# Patient Record
Sex: Male | Born: 1980 | Race: Black or African American | Hispanic: No | Marital: Single | Smoking: Current every day smoker
Health system: Southern US, Community
[De-identification: ages and names within clinical notes are randomized; demographics above are authoritative.]

## PROBLEM LIST (undated history)

## (undated) DIAGNOSIS — I10 Essential (primary) hypertension: Secondary | ICD-10-CM

## (undated) HISTORY — PX: BACK SURGERY: SHX140

## (undated) HISTORY — DX: Essential (primary) hypertension: I10

## (undated) HISTORY — PX: SPINE SURGERY: SHX786

---

## 2018-08-24 ENCOUNTER — Other Ambulatory Visit: Payer: Self-pay

## 2018-08-24 ENCOUNTER — Emergency Department
Admission: EM | Admit: 2018-08-24 | Discharge: 2018-08-24 | Disposition: A | Discharge status: Home / Self Care | Payer: Self-pay | Attending: Emergency Medicine | Admitting: Emergency Medicine

## 2018-08-24 DIAGNOSIS — R110 Nausea: Secondary | ICD-10-CM | POA: Insufficient documentation

## 2018-08-24 DIAGNOSIS — R0981 Nasal congestion: Secondary | ICD-10-CM | POA: Insufficient documentation

## 2018-08-24 DIAGNOSIS — J019 Acute sinusitis, unspecified: Secondary | ICD-10-CM | POA: Insufficient documentation

## 2018-08-24 MED ORDER — AMOXICILLIN-POT CLAVULANATE 875-125 MG PO TABS: 1.0000 | ORAL_TABLET | Freq: Two times a day (BID) | ORAL | 0 refills | Status: AC

## 2018-08-24 NOTE — ED Provider Notes (Signed)
[facility] Emergency Department Provider Note  Time seen: 11:31 AM  I have reviewed the triage vital signs and the nursing notes.   HISTORY  Chief Complaint Fever   HPI Christian [name] is a 38 y.o. male with no significant past medical history presents to the emergency department for headache and nasal congestion along with nausea.  According to the patient his girlfriend's aunt recently went to [place].  Patient denies any personal travel history.  Denies any known fevers at home.  Patient states occasional nausea, his main concern is congestion and headache.  No abdominal pain.  No chest pain.  Largely negative review of systems otherwise.   No past medical history on file.  There are no active problems to display for this patient.   Prior to Admission medications   Not on File    No Known Allergies  No family history on file.  Social History Social History   Tobacco Use  . Smoking status: Not on file  Substance Use Topics  . Alcohol use: Not on file  . Drug use: Not on file    Review of Systems Constitutional: Negative for fever. ENT: States nasal congestion/sinus pressure.  Sore throat. Cardiovascular: Negative for chest pain. Respiratory: Negative for shortness of breath.  No cough. Gastrointestinal: Negative for abdominal pain.  Occasional nausea.  No diarrhea. Genitourinary: Negative for urinary compaints Musculoskeletal: Negative for musculoskeletal complaints Skin: Negative for skin complaints  Neurological: Mild headache All other ROS negative  ____________________________________________   PHYSICAL EXAM:  VITAL SIGNS: ED Triage Vitals  Enc Vitals Group     BP 08/24/18 1109 (!) 170/113     Pulse Rate 08/24/18 1109 (!) 133     Resp --      Temp 08/24/18 1109 98.3 F (36.8 C)     Temp Source 08/24/18 1109 Oral     SpO2 08/24/18 1109 96 %     Weight 08/24/18 1110 200 lb (90.7 kg)     Height 08/24/18 1110 5\' 9"  (1.753 m)     Head Circumference --      Peak Flow --      Pain Score 08/24/18 1115 0     Pain Loc --      Pain Edu? --      Excl. in GC? --    Constitutional: Alert and oriented. Well appearing and in no distress. Eyes: Normal exam ENT   Head: Normocephalic and atraumatic.  Normal tympanic membranes   Nose: Moderate rhinorrhea, mild facial tenderness   Mouth/Throat: Mucous membranes are moist.  No pharyngeal erythema Cardiovascular: Normal rate, regular rhythm. No murmur Respiratory: Normal respiratory effort without tachypnea nor retractions. Breath sounds are clear Gastrointestinal: Soft and nontender. No distention.   Musculoskeletal: Nontender with normal range of motion in all extremities. Neurologic:  Normal speech and language. No gross focal neurologic deficits Skin:  Skin is warm, dry and intact.  Psychiatric: Mood and affect are normal.   ____________________________________________   INITIAL IMPRESSION / ASSESSMENT AND PLAN / ED COURSE  Pertinent labs & imaging results that were available during my care of the patient were reviewed by me and considered in my medical decision making (see chart for details).  Patient presents emergency department for symptoms of nasal congestion headache sinus pressure mild sore throat and nausea.  Symptoms are very suggestive of sinusitis.  Patient has no personal travel history, no fever, extremely low suspicion for coronavirus.  We will place the patient on antibiotic appropriate for  sinusitis and discharge with PCP follow-up.  Patient is requesting a note for work today.  Christian [name] was evaluated in Emergency Department on 08/24/2018 for the symptoms described in the history of present illness. He was evaluated in the context of the global COVID-19 pandemic, which necessitated consideration that the patient might be at risk for infection with the SARS-CoV-2 virus that causes COVID-19. Institutional protocols and algorithms that pertain to  the evaluation of patients at risk for COVID-19 are in a state of rapid change based on information released by regulatory bodies including the CDC and federal and state organizations. These policies and algorithms were followed during the patient's care in the ED.  ____________________________________________   FINAL CLINICAL IMPRESSION(S) / ED DIAGNOSES  Sinusitis   Minna Antis, MD 08/24/18 1135

## 2018-08-24 NOTE — ED Notes (Signed)
EDP at bedside  

## 2018-08-24 NOTE — ED Notes (Signed)
Pt number [phone]

## 2018-08-24 NOTE — ED Triage Notes (Signed)
Pt presents via POV c/o flu-like symptoms and fever x1 week. Report girlfriend has recent travel.

## 2019-04-16 ENCOUNTER — Encounter: Payer: Self-pay | Admitting: *Deleted

## 2019-04-16 ENCOUNTER — Other Ambulatory Visit: Payer: Self-pay

## 2019-04-16 DIAGNOSIS — R739 Hyperglycemia, unspecified: Secondary | ICD-10-CM | POA: Diagnosis present

## 2019-04-16 DIAGNOSIS — E876 Hypokalemia: Secondary | ICD-10-CM | POA: Diagnosis present

## 2019-04-16 DIAGNOSIS — K7010 Alcoholic hepatitis without ascites: Secondary | ICD-10-CM | POA: Diagnosis present

## 2019-04-16 DIAGNOSIS — R000 Tachycardia, unspecified: Secondary | ICD-10-CM | POA: Diagnosis present

## 2019-04-16 DIAGNOSIS — Z20828 Contact with and (suspected) exposure to other viral communicable diseases: Secondary | ICD-10-CM | POA: Diagnosis present

## 2019-04-16 DIAGNOSIS — F10139 Alcohol abuse with withdrawal, unspecified: Principal | ICD-10-CM | POA: Diagnosis present

## 2019-04-16 DIAGNOSIS — F17200 Nicotine dependence, unspecified, uncomplicated: Secondary | ICD-10-CM | POA: Diagnosis present

## 2019-04-16 DIAGNOSIS — R030 Elevated blood-pressure reading, without diagnosis of hypertension: Secondary | ICD-10-CM | POA: Diagnosis present

## 2019-04-16 DIAGNOSIS — Y900 Blood alcohol level of less than 20 mg/100 ml: Secondary | ICD-10-CM | POA: Diagnosis present

## 2019-04-16 DIAGNOSIS — D6959 Other secondary thrombocytopenia: Secondary | ICD-10-CM | POA: Diagnosis present

## 2019-04-16 DIAGNOSIS — R569 Unspecified convulsions: Secondary | ICD-10-CM | POA: Diagnosis present

## 2019-04-16 DIAGNOSIS — F1290 Cannabis use, unspecified, uncomplicated: Secondary | ICD-10-CM | POA: Diagnosis present

## 2019-04-16 LAB — URINE DRUG SCREEN, QUALITATIVE (ARMC ONLY)
Amphetamines, Ur Screen: NOT DETECTED
Barbiturates, Ur Screen: NOT DETECTED
Benzodiazepine, Ur Scrn: NOT DETECTED
Cannabinoid 50 Ng, Ur ~~LOC~~: NOT DETECTED
Cocaine Metabolite,Ur ~~LOC~~: NOT DETECTED
MDMA (Ecstasy)Ur Screen: NOT DETECTED
Methadone Scn, Ur: NOT DETECTED
Opiate, Ur Screen: NOT DETECTED
Phencyclidine (PCP) Ur S: NOT DETECTED
Tricyclic, Ur Screen: NOT DETECTED

## 2019-04-16 LAB — COMPREHENSIVE METABOLIC PANEL
ALT: 153 U/L — ABNORMAL HIGH (ref 0–44)
AST: 224 U/L — ABNORMAL HIGH (ref 15–41)
Albumin: 4.6 g/dL (ref 3.5–5.0)
Alkaline Phosphatase: 87 U/L (ref 38–126)
Anion gap: 17 — ABNORMAL HIGH (ref 5–15)
BUN: 7 mg/dL (ref 6–20)
CO2: 23 mmol/L (ref 22–32)
Calcium: 9.5 mg/dL (ref 8.9–10.3)
Chloride: 96 mmol/L — ABNORMAL LOW (ref 98–111)
Creatinine, Ser: 0.57 mg/dL — ABNORMAL LOW (ref 0.61–1.24)
GFR calc Af Amer: >60 mL/min (ref >60)
GFR calc non Af Amer: >60 mL/min (ref >60)
Glucose, Bld: 221 mg/dL — ABNORMAL HIGH (ref 70–99)
Potassium: 2.7 mmol/L — CL (ref 3.5–5.1)
Sodium: 136 mmol/L (ref 135–145)
Total Bilirubin: 2.5 mg/dL — ABNORMAL HIGH (ref 0.3–1.2)
Total Protein: 8.6 g/dL — ABNORMAL HIGH (ref 6.5–8.1)

## 2019-04-16 LAB — CBC
HCT: 37.9 % — ABNORMAL LOW (ref 39.0–52.0)
Hemoglobin: 14.2 g/dL (ref 13.0–17.0)
MCH: 33.1 pg (ref 26.0–34.0)
MCHC: 37.5 g/dL — ABNORMAL HIGH (ref 30.0–36.0)
MCV: 88.3 fL (ref 80.0–100.0)
Platelets: 60 10*3/uL — ABNORMAL LOW (ref 150–400)
RBC: 4.29 MIL/uL (ref 4.22–5.81)
RDW: 19.2 % — ABNORMAL HIGH (ref 11.5–15.5)
WBC: 5.5 10*3/uL (ref 4.0–10.5)
nRBC: 0 % (ref 0.0–0.2)

## 2019-04-16 LAB — ETHANOL: Alcohol, Ethyl (B): <10 mg/dL (ref <10)

## 2019-04-16 NOTE — ED Triage Notes (Signed)
Pt reports that his girlfriend had a hard time waking him up tonight so she called EMS. Pt says it has been about 5 days since he has had a drink. He said he has been shaky, vomiting. No SI/HI.

## 2019-04-17 ENCOUNTER — Observation Stay: Payer: Self-pay

## 2019-04-17 ENCOUNTER — Inpatient Hospital Stay
Admission: EM | Admit: 2019-04-17 | Discharge: 2019-04-18 | DRG: 897 | Disposition: A | Discharge status: Home / Self Care | Payer: Self-pay | Attending: Family Medicine | Admitting: Family Medicine

## 2019-04-17 ENCOUNTER — Encounter: Payer: Self-pay | Admitting: Internal Medicine

## 2019-04-17 DIAGNOSIS — F10239 Alcohol dependence with withdrawal, unspecified: Secondary | ICD-10-CM

## 2019-04-17 DIAGNOSIS — R0989 Other specified symptoms and signs involving the circulatory and respiratory systems: Secondary | ICD-10-CM

## 2019-04-17 DIAGNOSIS — F1010 Alcohol abuse, uncomplicated: Secondary | ICD-10-CM

## 2019-04-17 DIAGNOSIS — E876 Hypokalemia: Secondary | ICD-10-CM

## 2019-04-17 DIAGNOSIS — R569 Unspecified convulsions: Secondary | ICD-10-CM

## 2019-04-17 DIAGNOSIS — D696 Thrombocytopenia, unspecified: Secondary | ICD-10-CM | POA: Diagnosis present

## 2019-04-17 DIAGNOSIS — F10939 Alcohol use, unspecified with withdrawal, unspecified: Secondary | ICD-10-CM

## 2019-04-17 LAB — COMPREHENSIVE METABOLIC PANEL
ALT: 130 U/L — ABNORMAL HIGH (ref 0–44)
AST: 162 U/L — ABNORMAL HIGH (ref 15–41)
Albumin: 4.1 g/dL (ref 3.5–5.0)
Alkaline Phosphatase: 73 U/L (ref 38–126)
Anion gap: 12 (ref 5–15)
BUN: 7 mg/dL (ref 6–20)
CO2: 26 mmol/L (ref 22–32)
Calcium: 9.3 mg/dL (ref 8.9–10.3)
Chloride: 99 mmol/L (ref 98–111)
Creatinine, Ser: 0.58 mg/dL — ABNORMAL LOW (ref 0.61–1.24)
GFR calc Af Amer: >60 mL/min (ref >60)
GFR calc non Af Amer: >60 mL/min (ref >60)
Glucose, Bld: 129 mg/dL — ABNORMAL HIGH (ref 70–99)
Potassium: 2.8 mmol/L — ABNORMAL LOW (ref 3.5–5.1)
Sodium: 137 mmol/L (ref 135–145)
Total Bilirubin: 2.4 mg/dL — ABNORMAL HIGH (ref 0.3–1.2)
Total Protein: 7.7 g/dL (ref 6.5–8.1)

## 2019-04-17 LAB — CBC
HCT: 37.2 % — ABNORMAL LOW (ref 39.0–52.0)
Hemoglobin: 13.3 g/dL (ref 13.0–17.0)
MCH: 29.5 pg (ref 26.0–34.0)
MCHC: 35.8 g/dL (ref 30.0–36.0)
MCV: 82.5 fL (ref 80.0–100.0)
Platelets: 55 10*3/uL — ABNORMAL LOW (ref 150–400)
RBC: 4.51 MIL/uL (ref 4.22–5.81)
RDW: 16.9 % — ABNORMAL HIGH (ref 11.5–15.5)
WBC: 5.6 10*3/uL (ref 4.0–10.5)
nRBC: 0 % (ref 0.0–0.2)

## 2019-04-17 LAB — HEPATITIS PANEL, ACUTE
HCV Ab: NONREACTIVE
Hep A IgM: NONREACTIVE
Hep B C IgM: NONREACTIVE
Hepatitis B Surface Ag: NONREACTIVE

## 2019-04-17 LAB — VITAMIN B12: Vitamin B-12: 287 pg/mL (ref 180–914)

## 2019-04-17 LAB — HIV ANTIBODY (ROUTINE TESTING W REFLEX): HIV Screen 4th Generation wRfx: NONREACTIVE

## 2019-04-17 LAB — SARS CORONAVIRUS 2 (TAT 6-24 HRS): SARS Coronavirus 2: NEGATIVE

## 2019-04-17 LAB — HEMOGLOBIN A1C
Hgb A1c MFr Bld: 5.5 % (ref 4.8–5.6)
Mean Plasma Glucose: 111.15 mg/dL

## 2019-04-17 LAB — MAGNESIUM
Magnesium: 1.1 mg/dL — ABNORMAL LOW (ref 1.7–2.4)
Magnesium: 1.5 mg/dL — ABNORMAL LOW (ref 1.7–2.4)

## 2019-04-17 LAB — PHOSPHORUS: Phosphorus: 1.5 mg/dL — ABNORMAL LOW (ref 2.5–4.6)

## 2019-04-17 MED ORDER — LORAZEPAM 2 MG/ML IJ SOLN
2.0000 mg | Freq: Once | INTRAMUSCULAR | Status: AC
  Administered 2019-04-17: 2 mg via INTRAVENOUS

## 2019-04-17 MED ORDER — ACETAMINOPHEN 325 MG PO TABS: 650.0000 mg | ORAL_TABLET | Freq: Four times a day (QID) | ORAL | Status: DC | PRN

## 2019-04-17 MED ORDER — POTASSIUM CHLORIDE CRYS ER 20 MEQ PO TBCR
40.0000 meq | EXTENDED_RELEASE_TABLET | Freq: Two times a day (BID) | ORAL | Status: DC
  Administered 2019-04-17 (×2): 40 meq via ORAL
  Filled 2019-04-17: qty 2

## 2019-04-17 MED ORDER — MAGNESIUM SULFATE 2 GM/50ML IV SOLN
2.0000 g | Freq: Once | INTRAVENOUS | Status: AC
  Administered 2019-04-17: 2 g via INTRAVENOUS
  Filled 2019-04-17: qty 50

## 2019-04-17 MED ORDER — ACETAMINOPHEN 650 MG RE SUPP: 650.0000 mg | Freq: Four times a day (QID) | RECTAL | Status: DC | PRN

## 2019-04-17 MED ORDER — FOLIC ACID 1 MG PO TABS
1.0000 mg | ORAL_TABLET | Freq: Every day | ORAL | Status: DC
  Administered 2019-04-17: 1 mg via ORAL
  Filled 2019-04-17: qty 1

## 2019-04-17 MED ORDER — VITAMIN B-1 100 MG PO TABS
100.0000 mg | ORAL_TABLET | Freq: Every day | ORAL | Status: DC
  Administered 2019-04-17: 100 mg via ORAL
  Filled 2019-04-17: qty 1

## 2019-04-17 MED ORDER — LORAZEPAM 2 MG/ML IJ SOLN: 1.0000 mg | INTRAMUSCULAR | Status: DC | PRN

## 2019-04-17 MED ORDER — CHLORDIAZEPOXIDE HCL 25 MG PO CAPS: 25.0000 mg | ORAL_CAPSULE | Freq: Two times a day (BID) | ORAL | Status: DC

## 2019-04-17 MED ORDER — MAGNESIUM OXIDE 400 (241.3 MG) MG PO TABS
400.0000 mg | ORAL_TABLET | Freq: Once | ORAL | Status: AC
  Administered 2019-04-17: 400 mg via ORAL
  Filled 2019-04-17: qty 1

## 2019-04-17 MED ORDER — POTASSIUM CHLORIDE CRYS ER 20 MEQ PO TBCR: 40.0000 meq | EXTENDED_RELEASE_TABLET | ORAL | Status: AC

## 2019-04-17 MED ORDER — THIAMINE HCL 100 MG/ML IJ SOLN: 100.0000 mg | Freq: Every day | INTRAMUSCULAR | Status: DC

## 2019-04-17 MED ORDER — LORAZEPAM 1 MG PO TABS
1.0000 mg | ORAL_TABLET | ORAL | Status: DC | PRN
  Filled 2019-04-17: qty 1

## 2019-04-17 MED ORDER — LORAZEPAM 2 MG PO TABS
0.0000 mg | ORAL_TABLET | Freq: Four times a day (QID) | ORAL | Status: DC
  Administered 2019-04-17 (×2): 2 mg via ORAL
  Administered 2019-04-17: 1 mg via ORAL
  Filled 2019-04-17 (×2): qty 1

## 2019-04-17 MED ORDER — VITAMIN B-1 100 MG PO TABS: 100.0000 mg | ORAL_TABLET | Freq: Every day | ORAL | Status: DC

## 2019-04-17 MED ORDER — ONDANSETRON HCL 4 MG/2ML IJ SOLN: 4.0000 mg | Freq: Four times a day (QID) | INTRAMUSCULAR | Status: DC | PRN

## 2019-04-17 MED ORDER — CHLORDIAZEPOXIDE HCL 25 MG PO CAPS: 25.0000 mg | ORAL_CAPSULE | Freq: Every day | ORAL | Status: DC

## 2019-04-17 MED ORDER — LABETALOL HCL 5 MG/ML IV SOLN
INTRAVENOUS | Status: AC
  Administered 2019-04-17: 10 mg via INTRAVENOUS
  Filled 2019-04-17: qty 4

## 2019-04-17 MED ORDER — LORAZEPAM 2 MG/ML IJ SOLN
1.0000 mg | Freq: Once | INTRAMUSCULAR | Status: AC
  Administered 2019-04-17: 1 mg via INTRAVENOUS

## 2019-04-17 MED ORDER — LORAZEPAM 2 MG PO TABS: 0.0000 mg | ORAL_TABLET | Freq: Two times a day (BID) | ORAL | Status: DC

## 2019-04-17 MED ORDER — LORAZEPAM 2 MG/ML IJ SOLN
0.0000 mg | Freq: Four times a day (QID) | INTRAMUSCULAR | Status: DC
  Administered 2019-04-17: 1 mg via INTRAVENOUS

## 2019-04-17 MED ORDER — POTASSIUM CHLORIDE 10 MEQ/100ML IV SOLN
10.0000 meq | INTRAVENOUS | Status: AC
  Administered 2019-04-17 (×4): 10 meq via INTRAVENOUS
  Filled 2019-04-17 (×4): qty 100

## 2019-04-17 MED ORDER — LORAZEPAM 2 MG PO TABS: 0.0000 mg | ORAL_TABLET | Freq: Four times a day (QID) | ORAL | Status: DC

## 2019-04-17 MED ORDER — NICOTINE 21 MG/24HR TD PT24
21.0000 mg | MEDICATED_PATCH | Freq: Every day | TRANSDERMAL | Status: DC
  Administered 2019-04-17: 21 mg via TRANSDERMAL
  Filled 2019-04-17: qty 1

## 2019-04-17 MED ORDER — LORAZEPAM 2 MG/ML IJ SOLN: 0.0000 mg | Freq: Two times a day (BID) | INTRAMUSCULAR | Status: DC

## 2019-04-17 MED ORDER — ONDANSETRON HCL 4 MG PO TABS: 4.0000 mg | ORAL_TABLET | Freq: Four times a day (QID) | ORAL | Status: DC | PRN

## 2019-04-17 MED ORDER — GABAPENTIN 400 MG PO CAPS
400.0000 mg | ORAL_CAPSULE | Freq: Three times a day (TID) | ORAL | Status: DC
  Administered 2019-04-17 (×3): 400 mg via ORAL
  Filled 2019-04-17 (×7): qty 1

## 2019-04-17 MED ORDER — ADULT MULTIVITAMIN W/MINERALS CH
1.0000 | ORAL_TABLET | Freq: Every day | ORAL | Status: DC
  Administered 2019-04-17: 1 via ORAL
  Filled 2019-04-17: qty 1

## 2019-04-17 MED ORDER — CHLORDIAZEPOXIDE HCL 25 MG PO CAPS
25.0000 mg | ORAL_CAPSULE | Freq: Three times a day (TID) | ORAL | Status: AC
  Administered 2019-04-17 (×3): 25 mg via ORAL
  Filled 2019-04-17 (×3): qty 1

## 2019-04-17 MED ORDER — LABETALOL HCL 5 MG/ML IV SOLN
10.0000 mg | INTRAVENOUS | Status: DC | PRN
  Administered 2019-04-17: 06:00:00 10 mg via INTRAVENOUS
  Filled 2019-04-17: qty 4

## 2019-04-17 NOTE — ED Notes (Signed)
Pt alert, given meal try and drink as requested. No emesis.

## 2019-04-17 NOTE — Progress Notes (Signed)
Pt went out side to smoke. Nurse educated pt that he can not do that. notify MD to request patch.

## 2019-04-17 NOTE — ED Notes (Addendum)
ED TO INPATIENT HANDOFF REPORT  ED Nurse Name and Phone #: ally [phone]  S Name/Age/Gender Christian [name] 38 y.o. male Room/Bed: ED01A/ED01A  Code Status   Code Status: Full Code  Home/SNF/Other Home Patient oriented to: self, place, time and situation Is this baseline? Yes   Triage Complete: Triage complete  Chief Complaint withdrawls ems  Triage Note Pt reports that his girlfriend had a hard time waking him up tonight so she called EMS. Pt says it has been about 5 days since he has had a drink. He said he has been shaky, vomiting. No SI/HI.      Allergies No Known Allergies  Level of Care/Admitting Diagnosis ED Disposition    ED Disposition Condition Comment   Admit  Hospital Area: [facility] [[id number]]  Level of Care: Med-Surg [16]  Covid Evaluation: Asymptomatic Screening Protocol (No Symptoms)  Diagnosis: Seizures (HCC) [119147]  Admitting Physician: Eduard Clos [phone]  Attending Physician: Eduard Clos Florian.Pax  PT Class (Do Not Modify): Observation [104]  PT Acc Code (Do Not Modify): Observation [10022]       B Medical/Surgery History History reviewed. No pertinent past medical history. Past Surgical History:  Procedure Laterality Date  . BACK SURGERY       A IV Location/Drains/Wounds Patient Lines/Drains/Airways Status   Active Line/Drains/Airways    Name:   Placement date:   Placement time:   Site:   Days:   Peripheral IV 04/17/19 Right Antecubital   04/17/19    0039    Antecubital   less than 1   Peripheral IV 04/17/19 Left Forearm   04/17/19    0340    Forearm   less than 1          Intake/Output Last 24 hours No intake or output data in the 24 hours ending 04/17/19 1051  Labs/Imaging Results for orders placed or performed during the hospital encounter of 04/17/19 (from the past 48 hour(s))  Comprehensive metabolic panel     Status: Abnormal   Collection Time: 04/16/19  9:06 PM  Result Value Ref Range   Sodium 136 135 - 145 mmol/L   Potassium 2.7 (LL) 3.5 - 5.1 mmol/L    Comment: CRITICAL RESULT CALLED TO, READ BACK BY AND VERIFIED WITH LISA THOMPSON 04/16/19 @ 2201  MLK    Chloride 96 (L) 98 - 111 mmol/L   CO2 23 22 - 32 mmol/L   Glucose, Bld 221 (H) 70 - 99 mg/dL   BUN 7 6 - 20 mg/dL   Creatinine, Ser 6.21 (L) 0.61 - 1.24 mg/dL   Calcium 9.5 8.9 - 30.8 mg/dL   Total Protein 8.6 (H) 6.5 - 8.1 g/dL   Albumin 4.6 3.5 - 5.0 g/dL   AST 657 (H) 15 - 41 U/L   ALT 153 (H) 0 - 44 U/L   Alkaline Phosphatase 87 38 - 126 U/L   Total Bilirubin 2.5 (H) 0.3 - 1.2 mg/dL   GFR calc non Af Amer >60 >60 mL/min   GFR calc Af Amer >60 >60 mL/min   Anion gap 17 (H) 5 - 15    Comment: Performed at [facility], [address] [place], [place] [address]  Ethanol     Status: None   Collection Time: 04/16/19  9:06 PM  Result Value Ref Range   Alcohol, Ethyl (B) <10 <10 mg/dL    Comment: (NOTE) Lowest detectable limit for serum alcohol is 10 mg/dL. For medical purposes only. Performed at  [facility], [address] [place], [place] [address]   cbc     Status: Abnormal   Collection Time: 04/16/19  9:06 PM  Result Value Ref Range   WBC 5.5 4.0 - 10.5 K/uL   RBC 4.29 4.22 - 5.81 MIL/uL   Hemoglobin 14.2 13.0 - 17.0 g/dL   HCT 60.437.9 (L) 54.039.0 - 98.152.0 %   MCV 88.3 80.0 - 100.0 fL   MCH 33.1 26.0 - 34.0 pg   MCHC 37.5 (H) 30.0 - 36.0 g/dL   RDW 19.119.2 (H) 47.811.5 - 29.515.5 %   Platelets 60 (L) 150 - 400 K/uL    Comment: Immature Platelet Fraction may be clinically indicated, consider ordering this additional test [id number] PLATELET COUNT CONFIRMED BY SMEAR    nRBC 0.0 0.0 - 0.2 %    Comment: Performed at [name], [address] [place], [place] [address]  Urine Drug Screen, Qualitative     Status: None   Collection Time: 04/16/19  9:06 PM  Result Value Ref Range   Tricyclic, Ur Screen NONE DETECTED NONE DETECTED   Amphetamines, Ur Screen NONE DETECTED NONE DETECTED    MDMA (Ecstasy)Ur Screen NONE DETECTED NONE DETECTED   Cocaine Metabolite,Ur [place] NONE DETECTED NONE DETECTED   Opiate, Ur Screen NONE DETECTED NONE DETECTED   Phencyclidine (PCP) Ur S NONE DETECTED NONE DETECTED   Cannabinoid 50 Ng, Ur [place] NONE DETECTED NONE DETECTED   Barbiturates, Ur Screen NONE DETECTED NONE DETECTED   Benzodiazepine, Ur Scrn NONE DETECTED NONE DETECTED   Methadone Scn, Ur NONE DETECTED NONE DETECTED    Comment: (NOTE) Tricyclics + metabolites, urine    Cutoff 1000 ng/mL Amphetamines + metabolites, urine  Cutoff 1000 ng/mL MDMA (Ecstasy), urine              Cutoff 500 ng/mL Cocaine Metabolite, urine          Cutoff 300 ng/mL Opiate + metabolites, urine        Cutoff 300 ng/mL Phencyclidine (PCP), urine         Cutoff 25 ng/mL Cannabinoid, urine                 Cutoff 50 ng/mL Barbiturates + metabolites, urine  Cutoff 200 ng/mL Benzodiazepine, urine              Cutoff 200 ng/mL Methadone, urine                   Cutoff 300 ng/mL The urine drug screen provides only a preliminary, unconfirmed analytical test result and should not be used for non-medical purposes. Clinical consideration and professional judgment should be applied to any positive drug screen result due to possible interfering substances. A more specific alternate chemical method must be used in order to obtain a confirmed analytical result. Gas chromatography / mass spectrometry (GC/MS) is the preferred confirmat ory method. Performed at [facility], [address] [place], [place] [address]   Magnesium     Status: Abnormal   Collection Time: 04/16/19  9:06 PM  Result Value Ref Range   Magnesium 1.1 (L) 1.7 - 2.4 mg/dL    Comment: Performed at [facility], [address] [place], [place] [address]  Comprehensive metabolic panel     Status: Abnormal   Collection Time: 04/17/19  3:46 AM  Result Value Ref Range   Sodium 137 135 - 145 mmol/L   Potassium 2.8 (L) 3.5 - 5.1  mmol/L   Chloride 99  98 - 111 mmol/L   CO2 26 22 - 32 mmol/L   Glucose, Bld 129 (H) 70 - 99 mg/dL   BUN 7 6 - 20 mg/dL   Creatinine, Ser 2.62 (L) 0.61 - 1.24 mg/dL   Calcium 9.3 8.9 - 03.5 mg/dL   Total Protein 7.7 6.5 - 8.1 g/dL   Albumin 4.1 3.5 - 5.0 g/dL   AST 597 (H) 15 - 41 U/L   ALT 130 (H) 0 - 44 U/L   Alkaline Phosphatase 73 38 - 126 U/L   Total Bilirubin 2.4 (H) 0.3 - 1.2 mg/dL   GFR calc non Af Amer >60 >60 mL/min   GFR calc Af Amer >60 >60 mL/min   Anion gap 12 5 - 15    Comment: Performed at [facility], [address] [place], [place] [address]  Magnesium     Status: Abnormal   Collection Time: 04/17/19  3:46 AM  Result Value Ref Range   Magnesium 1.5 (L) 1.7 - 2.4 mg/dL    Comment: Performed at [facility], [address], [place], [place] [address]  Phosphorus     Status: Abnormal   Collection Time: 04/17/19  3:46 AM  Result Value Ref Range   Phosphorus 1.5 (L) 2.5 - 4.6 mg/dL    Comment: Performed at [facility], [address] [place], [place] [address]  CBC     Status: Abnormal   Collection Time: 04/17/19  3:46 AM  Result Value Ref Range   WBC 5.6 4.0 - 10.5 K/uL   RBC 4.51 4.22 - 5.81 MIL/uL   Hemoglobin 13.3 13.0 - 17.0 g/dL   HCT 12.2 (L) 48.2 - 50.0 %   MCV 82.5 80.0 - 100.0 fL   MCH 29.5 26.0 - 34.0 pg   MCHC 35.8 30.0 - 36.0 g/dL    Comment: CORRECTED FOR COLD AGGLUTININS   RDW 16.9 (H) 11.5 - 15.5 %   Platelets 55 (L) 150 - 400 K/uL    Comment: Immature Platelet Fraction may be clinically indicated, consider ordering this additional test [id number]    nRBC 0.0 0.0 - 0.2 %    Comment: Performed at [facility], [address] [place], [place] [address]  Hemoglobin A1c     Status: None   Collection Time: 04/17/19  6:05 AM  Result Value Ref Range   Hgb A1c MFr Bld 5.5 4.8 - 5.6 %    Comment: (NOTE) Pre diabetes:          5.7%-6.4% Diabetes:              >6.4% Glycemic control for   <7.0% adults  with diabetes    Mean Plasma Glucose 111.15 mg/dL    Comment: Performed at [facility], [address] [place], [place] [address]   Ct Head Wo Contrast  Result Date: 04/17/2019 CLINICAL DATA:  38 year old male with altered mental status. EXAM: CT HEAD WITHOUT CONTRAST TECHNIQUE: Contiguous axial images were obtained from the base of the skull through the vertex without intravenous contrast. COMPARISON:  None. FINDINGS: Brain: No midline shift, ventriculomegaly, mass effect, evidence of mass lesion, intracranial hemorrhage or evidence of cortically based acute infarction. Gray-white matter differentiation is within normal limits throughout the brain. Vascular: No suspicious intracranial vascular hyperdensity. Skull: Negative. Sinuses/Orbits: Visualized paranasal sinuses and mastoids are clear. Other: Visualized orbits and scalp soft tissues are within normal limits. IMPRESSION: Normal noncontrast head CT. Electronically Signed   By: Althea Grimmer.D.  On: 04/17/2019 02:20   Dg Chest Port 1 View  Result Date: 04/17/2019 CLINICAL DATA:  Seizure, abnormal lung sounds, reported history of alcoholism EXAM: PORTABLE CHEST 1 VIEW COMPARISON:  None. FINDINGS: Posterior spinal fusion hardware overlies the upper and lower thoracic spine. Normal heart size. Normal mediastinal contour. No pneumothorax. No pleural effusion. Lungs appear clear, with no acute consolidative airspace disease and no pulmonary edema. IMPRESSION: No active disease. Electronically Signed   By: Delbert Phenix M.D.   On: 04/17/2019 09:30    Pending Labs Unresulted Labs (From admission, onward)    Start     Ordered   04/17/19 0909  Vitamin B12  Add-on,   AD     04/17/19 1610   04/17/19 0553  Hepatitis panel, acute  Once,   STAT     04/17/19 0552   04/17/19 0500  HIV Antibody (routine testing w rflx)  (HIV Antibody (Routine testing w reflex) panel)  Tomorrow morning,   STAT     04/17/19 0318   04/17/19 0101  SARS CORONAVIRUS 2 (TAT  6-24 HRS) Nasopharyngeal Nasopharyngeal Swab  (Asymptomatic/Tier 2 Patients Labs)  Once,   STAT    Question Answer Comment  Is this test for diagnosis or screening Screening   Symptomatic for COVID-19 as defined by CDC No   Hospitalized for COVID-19 No   Admitted to ICU for COVID-19 No   Previously tested for COVID-19 No   Resident in a congregate (group) care setting No   Employed in healthcare setting No      04/17/19 0100          Vitals/Pain Today's Vitals   04/17/19 0730 04/17/19 0739 04/17/19 0800 04/17/19 1033  BP: (!) 148/108  (!) 136/102 (!) 143/96  Pulse: 94  (!) 101 (!) 106  Resp: 20  (!) 21   Temp:      TempSrc:      SpO2: 100%  100% 100%  Weight:      Height:      PainSc:  0-No pain  0-No pain    Isolation Precautions No active isolations  Medications Medications  LORazepam (ATIVAN) tablet 1-4 mg (has no administration in time range)    Or  LORazepam (ATIVAN) injection 1-4 mg (has no administration in time range)  thiamine (VITAMIN B-1) tablet 100 mg (100 mg Oral Given 04/17/19 0854)    Or  thiamine (B-1) injection 100 mg ( Intravenous See Alternative 04/17/19 0854)  folic acid (FOLVITE) tablet 1 mg (1 mg Oral Given 04/17/19 0853)  multivitamin with minerals tablet 1 tablet (1 tablet Oral Given 04/17/19 0853)  acetaminophen (TYLENOL) tablet 650 mg (has no administration in time range)    Or  acetaminophen (TYLENOL) suppository 650 mg (has no administration in time range)  LORazepam (ATIVAN) tablet 0-4 mg (2 mg Oral Given 04/17/19 0853)    Followed by  LORazepam (ATIVAN) tablet 0-4 mg (has no administration in time range)  potassium chloride SA (KLOR-CON) CR tablet 40 mEq (0 mEq Oral Hold 04/17/19 0545)  labetalol (NORMODYNE) injection 10 mg (10 mg Intravenous Given 04/17/19 0604)  chlordiazePOXIDE (LIBRIUM) capsule 25 mg (25 mg Oral Given 04/17/19 0932)    Followed by  chlordiazePOXIDE (LIBRIUM) capsule 25 mg (has no administration in time range)     Followed by  chlordiazePOXIDE (LIBRIUM) capsule 25 mg (has no administration in time range)  gabapentin (NEURONTIN) capsule 400 mg (400 mg Oral Given 04/17/19 0932)  LORazepam (ATIVAN) injection 2 mg (2 mg Intravenous Given  04/17/19 0042)  LORazepam (ATIVAN) injection 1 mg (1 mg Intravenous Given 04/17/19 0042)  potassium chloride 10 mEq in 100 mL IVPB (0 mEq Intravenous Stopped 04/17/19 0558)  magnesium sulfate IVPB 2 g 50 mL (0 g Intravenous Stopped 04/17/19 0647)    Mobility walks High fall risk   Focused Assessments Neuro Assessment Handoff:   Cardiac Rhythm: Normal sinus rhythm       Neuro Assessment:   Neuro Checks:      Last Documented NIHSS Modified Score:   Has TPA been given? No If patient is a Neuro Trauma and patient is going to OR before floor call report to [facility] nurse: [phone] or [phone]     R Recommendations: See Admitting Provider Note  Report given to:   Additional Notes:  Pt had seizure in ER.  No seizure this AM, eating and drinking, ambulates to restroom safely.

## 2019-04-17 NOTE — Progress Notes (Addendum)
PROGRESS NOTE    Christian [name]  VHQ:[id number] DOB: [birth year] DOA: 04/17/2019 PCP: Patient, No Pcp Per      Brief Narrative:  Christian [name] is a 38 y.o. M with alcohol use disorder who presented after a witnessed seizure.  The patient is interviewing for a new job soon, and so he quit drinking 48 hours prior to admission.  He initially had shakes, malaise, nausea, but these gradually resolved until the day of admission he was witnessed to have a generalized tonic-clonic seizure for several minutes.  In the ER, the patient had another witnessed tonic-clonic seizure, which was aborted with Ativan.  Potassium 2.7, LFTs slightly elevated, alcohol level negative.       Assessment & Plan:  Alcohol withdrawal seizure Alcohol use disorder -Start Librium taper -Start gabapentin 400 3 times daily -Consult social work -Surveyor, quantity with on-demand additional Ativan -Continue thiamine and folate -Seizure precautions    Hypokalemia Hypomagnesemia -Supplement potassium -Supplement magnesium  Thrombocytopenia Due to alcohol use likely. -Follow-up with PCP  Transaminitis Ackley alcoholic hepatitis. -Trend LFTs -Follow-up acute hepatitis panel    DVT prophylaxis: SCDs Code Status: Full code Family Communication: Fiance MDM and disposition Plan: This is a no charge note.  For further details, please see H&P by my partner Dr. Hal Hope from earlier today.  The below labs and imaging reports were reviewed and summarized above.    The patient was admitted with alcoholic withdrawal related seizure.    Objective: Vitals:   04/17/19 0800 04/17/19 1033 04/17/19 1241 04/17/19 1430  BP: (!) 136/102 (!) 143/96 (!) 132/91 (!) 130/94  Pulse: (!) 101 (!) 106 95 100  Resp: (!) 21  20 (!) 23  Temp:      TempSrc:      SpO2: 100% 100% 96% 97%  Weight:      Height:       No intake or output data in the 24 hours ending 04/17/19 1507 Filed Weights   04/16/19 2102  Weight: 72.1 kg     Examination: The patient was seen and examined.      Data Reviewed: I have personally reviewed following labs and imaging studies:  CBC: Recent Labs  Lab 04/16/19 2106 04/17/19 0346  WBC 5.5 5.6  HGB 14.2 13.3  HCT 37.9* 37.2*  MCV 88.3 82.5  PLT 60* 55*   Basic Metabolic Panel: Recent Labs  Lab 04/16/19 2106 04/17/19 0346  NA 136 137  K 2.7* 2.8*  CL 96* 99  CO2 23 26  GLUCOSE 221* 129*  BUN 7 7  CREATININE 0.57* 0.58*  CALCIUM 9.5 9.3  MG 1.1* 1.5*  PHOS  --  1.5*   GFR: Estimated Creatinine Clearance: 125.2 mL/min (A) (by C-G formula based on SCr of 0.58 mg/dL (L)). Liver Function Tests: Recent Labs  Lab 04/16/19 2106 04/17/19 0346  AST 224* 162*  ALT 153* 130*  ALKPHOS 87 73  BILITOT 2.5* 2.4*  PROT 8.6* 7.7  ALBUMIN 4.6 4.1   No results for input(s): LIPASE, AMYLASE in the last 168 hours. No results for input(s): AMMONIA in the last 168 hours. Coagulation Profile: No results for input(s): INR, PROTIME in the last 168 hours. Cardiac Enzymes: No results for input(s): CKTOTAL, CKMB, CKMBINDEX, TROPONINI in the last 168 hours. BNP (last 3 results) No results for input(s): PROBNP in the last 8760 hours. HbA1C: Recent Labs    04/17/19 0605  HGBA1C 5.5   CBG: No results for input(s): GLUCAP in the last 168 hours. Lipid Profile:  No results for input(s): CHOL, HDL, LDLCALC, TRIG, CHOLHDL, LDLDIRECT in the last 72 hours. Thyroid Function Tests: No results for input(s): TSH, T4TOTAL, FREET4, T3FREE, THYROIDAB in the last 72 hours. Anemia Panel: Recent Labs    04/17/19 0346  VITAMINB12 287   Urine analysis: No results found for: COLORURINE, APPEARANCEUR, LABSPEC, PHURINE, GLUCOSEU, HGBUR, BILIRUBINUR, KETONESUR, PROTEINUR, UROBILINOGEN, NITRITE, LEUKOCYTESUR Sepsis Labs: @LABRCNTIP (procalcitonin:4,lacticacidven:4)  ) Recent Results (from the past 240 hour(s))  SARS CORONAVIRUS 2 (TAT 6-24 HRS) Nasopharyngeal Nasopharyngeal Swab      Status: None   Collection Time: 04/17/19  2:20 AM   Specimen: Nasopharyngeal Swab  Result Value Ref Range Status   SARS Coronavirus 2 NEGATIVE NEGATIVE Final    Comment: (NOTE) SARS-CoV-2 target nucleic acids are NOT DETECTED. The SARS-CoV-2 RNA is generally detectable in upper and lower respiratory specimens during the acute phase of infection. Negative results do not preclude SARS-CoV-2 infection, do not rule out co-infections with other pathogens, and should not be used as the sole basis for treatment or other patient management decisions. Negative results must be combined with clinical observations, patient history, and epidemiological information. The expected result is Negative. Fact Sheet for Patients: 04/19/19 Fact Sheet for Healthcare Providers: HairSlick.no This test is not yet approved or cleared by the quierodirigir.com FDA and  has been authorized for detection and/or diagnosis of SARS-CoV-2 by FDA under an Emergency Use Authorization (EUA). This EUA will remain  in effect (meaning this test can be used) for the duration of the COVID-19 declaration under Section 56 4(b)(1) of the Act, 21 U.S.C. section 360bbb-3(b)(1), unless the authorization is terminated or revoked sooner. Performed at [facility], [address] [place], [place]          Radiology Studies: Ct Head Wo Contrast  Result Date: 04/17/2019 CLINICAL DATA:  39 year old male with altered mental status. EXAM: CT HEAD WITHOUT CONTRAST TECHNIQUE: Contiguous axial images were obtained from the base of the skull through the vertex without intravenous contrast. COMPARISON:  None. FINDINGS: Brain: No midline shift, ventriculomegaly, mass effect, evidence of mass lesion, intracranial hemorrhage or evidence of cortically based acute infarction. Gray-white matter differentiation is within normal limits throughout the brain. Vascular: No  suspicious intracranial vascular hyperdensity. Skull: Negative. Sinuses/Orbits: Visualized paranasal sinuses and mastoids are clear. Other: Visualized orbits and scalp soft tissues are within normal limits. IMPRESSION: Normal noncontrast head CT. Electronically Signed   By: 20 M.D.   On: 04/17/2019 02:20   Dg Chest Port 1 View  Result Date: 04/17/2019 CLINICAL DATA:  Seizure, abnormal lung sounds, reported history of alcoholism EXAM: PORTABLE CHEST 1 VIEW COMPARISON:  None. FINDINGS: Posterior spinal fusion hardware overlies the upper and lower thoracic spine. Normal heart size. Normal mediastinal contour. No pneumothorax. No pleural effusion. Lungs appear clear, with no acute consolidative airspace disease and no pulmonary edema. IMPRESSION: No active disease. Electronically Signed   By: 04/19/2019 M.D.   On: 04/17/2019 09:30        Scheduled Meds:  chlordiazePOXIDE  25 mg Oral TID   Followed by   04/19/2019 ON 04/18/2019] chlordiazePOXIDE  25 mg Oral BID   Followed by   04/20/2019 ON 04/19/2019] chlordiazePOXIDE  25 mg Oral Daily   folic acid  1 mg Oral Daily   gabapentin  400 mg Oral TID   LORazepam  0-4 mg Oral Q6H   Followed by   04/21/2019 ON 04/19/2019] LORazepam  0-4 mg Oral Q12H   multivitamin with  minerals  1 tablet Oral Daily   potassium chloride  40 mEq Oral BID   thiamine  100 mg Oral Daily   Or   thiamine  100 mg Intravenous Daily   Continuous Infusions:   LOS: 0 days    Time spent: 35 minutes    Alberteen Samhristopher P Seiya Silsby, MD [facility] 04/17/2019, 3:07 PM     Please page though AMION or Epic secure chat:  For password, contact charge nurse

## 2019-04-17 NOTE — ED Notes (Signed)
Patient transported to CT 

## 2019-04-17 NOTE — ED Provider Notes (Signed)
[facility] Emergency Department Provider Note    First MD Initiated Contact with Patient 04/17/19 0030     (approximate)  I have reviewed the triage vital signs and the nursing notes.   HISTORY  Chief Complaint Drug / Alcohol Assessment    HPI Christian [name] is a 38 y.o. male with history of alcohol abuse times "many years".  Presents to the emergency department secondary to episode at home that the patient's significant other describes as "he got all stiff and shaking".  Patient significant other stated that this episode lasted approximately 5 to 10 minutes.  She states that the patient was not responding to her during that this episode.  Patient states that he stopped drinking approximately 5 days ago.  Patient states that he has had "shakes when he stopped drinking in the past" he states that this would last for a brief period of time and then stop.  However this episode has been persistent for the past 5 days and worsening per the patient.       History reviewed. No pertinent past medical history.  There are no active problems to display for this patient.   Past Surgical History:  Procedure Laterality Date  . BACK SURGERY      Prior to Admission medications   Not on File    Allergies Patient has no known allergies.  No family history on file.  Social History Social History   Tobacco Use  . Smoking status: Current Every Day Smoker  Substance Use Topics  . Alcohol use: Yes  . Drug use: Yes    Types: Marijuana    Review of Systems Constitutional: No fever/chills Eyes: No visual changes. ENT: No sore throat. Cardiovascular: Denies chest pain. Respiratory: Denies shortness of breath. Gastrointestinal: No abdominal pain.  No nausea, no vomiting.  No diarrhea.  No constipation. Genitourinary: Negative for dysuria. Musculoskeletal: Negative for neck pain.  Negative for back pain. Integumentary: Negative for rash. Neurological:  Negative for headaches, focal weakness or numbness.  Positive for seizure   ____________________________________________   PHYSICAL EXAM:  VITAL SIGNS: ED Triage Vitals  Enc Vitals Group     BP 04/16/19 2059 (!) 150/106     Pulse Rate 04/16/19 2059 (!) 116     Resp 04/16/19 2059 20     Temp 04/16/19 2059 99.8 F (37.7 C)     Temp Source 04/16/19 2059 Oral     SpO2 04/16/19 2059 98 %     Weight 04/16/19 2102 72.1 kg (159 lb)     Height 04/16/19 2102 1.753 m (5\' 9" )     Head Circumference --      Peak Flow --      Pain Score 04/16/19 2102 0     Pain Loc --      Pain Edu? --      Excl. in GC? --     Constitutional: Alert and oriented.  Eyes: Conjunctivae are normal.  Scleral icterus Head: Atraumatic. Mouth/Throat: Patient is wearing a mask. Neck: No stridor.  No meningeal signs.   Cardiovascular: Normal rate, regular rhythm. Good peripheral circulation. Grossly normal heart sounds. Respiratory: Normal respiratory effort.  No retractions. Gastrointestinal: Soft and nontender. No distention.  Musculoskeletal: No lower extremity tenderness nor edema. No gross deformities of extremities. Neurologic:  Normal speech and language. No gross focal neurologic deficits are appreciated.  Skin:  Skin is warm, dry and intact. Psychiatric: Mood and affect are normal. Speech and behavior are normal.  ____________________________________________   LABS (all labs ordered are listed, but only abnormal results are displayed)  Labs Reviewed  COMPREHENSIVE METABOLIC PANEL - Abnormal; Notable for the following components:      Result Value   Potassium 2.7 (*)    Chloride 96 (*)    Glucose, Bld 221 (*)    Creatinine, Ser 0.57 (*)    Total Protein 8.6 (*)    AST 224 (*)    ALT 153 (*)    Total Bilirubin 2.5 (*)    Anion gap 17 (*)    All other components within normal limits  CBC - Abnormal; Notable for the following components:   HCT 37.9 (*)    MCHC 37.5 (*)    RDW 19.2 (*)     Platelets 60 (*)    All other components within normal limits  ETHANOL  URINE DRUG SCREEN, QUALITATIVE ([facility] ONLY)  MAGNESIUM   ____________________________________________  EKG  ED ECG REPORT I, Alpaugh N Carry Ortez, the attending physician, personally viewed and interpreted this ECG.   Date: 04/17/2019  EKG Time: 9:15 PM  Rate: 112  Rhythm: Sinus tachycardia  Axis: Normal  Intervals: Normal  ST&T Change: None  _________________________________  .Critical Care Performed by: Darci CurrentBrown, Martin N, MD Authorized by: Darci CurrentBrown, Merkel N, MD   Critical care provider statement:    Critical care time (minutes):  45   Critical care time was exclusive of:  Separately billable procedures and treating other patients (Alcohol withdrawal seizure)   Critical care was necessary to treat or prevent imminent or life-threatening deterioration of the following conditions:  CNS failure or compromise   Critical care was time spent personally by me on the following activities:  Development of treatment plan with patient or surrogate, discussions with consultants, evaluation of patient's response to treatment, examination of patient, obtaining history from patient or surrogate, ordering and performing treatments and interventions, ordering and review of laboratory studies, ordering and review of radiographic studies, pulse oximetry, re-evaluation of patient's condition and review of old charts   I assumed direction of critical care for this patient from another provider in my specialty: no       ____________________________________________   INITIAL IMPRESSION / MDM / ASSESSMENT AND PLAN / ED COURSE  As part of my medical decision making, I reviewed the following data within the electronic MEDICAL RECORD NUMBER   38 year old male presented with above-stated history and physical exam consistent with alcohol withdrawal with possible seizure at home.  During my evaluation the patient had a witnessed generalized  tonic-clonic seizure lasting approximately 3 minutes.  Patient was given IM Ativan 2 mg and subsequently IV Ativan 2 mg with resolution of seizures.  Regarding patient's hypokalemia with a potassium of 2.7 noted on laboratory data.  Patient given potassium chloride 10 mg IV x4 doses.  ____________________________________________  FINAL CLINICAL IMPRESSION(S) / ED DIAGNOSES  Final diagnoses:  Alcohol withdrawal seizure with complication (HCC)  Hypokalemia     MEDICATIONS GIVEN DURING THIS VISIT:  Medications  LORazepam (ATIVAN) injection 2 mg (2 mg Intravenous Given 04/17/19 0042)  LORazepam (ATIVAN) injection 1 mg (1 mg Intravenous Given 04/17/19 0042)     ED Discharge Orders    None      *Please note:  Sharren BridgeKenneth Coberly was evaluated in Emergency Department on 04/17/2019 for the symptoms described in the history of present illness. He was evaluated in the context of the global COVID-19 pandemic, which necessitated consideration that the patient might be at risk for infection  with the SARS-CoV-2 virus that causes COVID-19. Institutional protocols and algorithms that pertain to the evaluation of patients at risk for COVID-19 are in a state of rapid change based on information released by regulatory bodies including the CDC and federal and state organizations. These policies and algorithms were followed during the patient's care in the ED.  Some ED evaluations and interventions may be delayed as a result of limited staffing during the pandemic.*  Note:  This document was prepared using Dragon voice recognition software and may include unintentional dictation errors.   Gregor Hams, MD 04/17/19 [phone]

## 2019-04-17 NOTE — ED Notes (Signed)
Given  breakfast

## 2019-04-17 NOTE — ED Notes (Signed)
Admitting at bedside 

## 2019-04-17 NOTE — ED Notes (Signed)
Given lunch, girlfriend at bedside.

## 2019-04-17 NOTE — H&P (Addendum)
History and Physical    Christian [name] ZOX:[id number] DOB: 1980-10-20 DOA: 04/17/2019  PCP: Patient, No Pcp Per  Patient coming from: Home.  Chief Complaint: Seizures.  HPI: Christian [name] is a 38 y.o. male with history of alcoholism presents to the ER after being witnessed to have seizure by patient's girlfriend.  Patient girlfriend stated that he was at home and started having generalized tonic-clonic seizure which almost lasted for 20 minutes.  EMS was called and patient was brought to the ER.  Patient is well found to state that he had a seizure about a week ago more of head-nodding last week.  Patient at the time of my exam has become more alert awake after a postictal state and states that he has cut down his alcohol 4 days ago.  Denies any headache visual symptoms or any focal deficits.  ED Course: In the ER patient had another weakness tonic-clonic seizure lasting for a informants for which patient was given 2 mg intramuscular Ativan following which patient was given 2 mg IV Ativan.  CT head is unremarkable.  Labs show potassium 2.7 creatinine 0.57 elevated LFTs of AST 224 ALT 153 blood glucose 221 platelet 60 EKG sinus tachycardia.  Alcohol level is negative urine drug screen negative.  Patient admitted for likely seizure precipitated by alcohol withdrawal.  Review of Systems: As per HPI, rest all negative.   History reviewed. No pertinent past medical history.  Past Surgical History:  Procedure Laterality Date  . BACK SURGERY       reports that he has been smoking. He has never used smokeless tobacco. He reports current alcohol use. He reports current drug use. Drug: Marijuana.  No Known Allergies  Family History  Problem Relation Age of Onset  . Seizures Neg Hx     Prior to Admission medications   Not on File    Physical Exam: Constitutional: Moderately built and nourished. Vitals:   04/16/19 2059 04/16/19 2102 04/17/19 0130  BP: (!) 150/106  (!) 150/111  Pulse:  (!) 116  (!) 114  Resp: 20  17  Temp: 99.8 F (37.7 C)    TempSrc: Oral    SpO2: 98%  100%  Weight:  72.1 kg   Height:  5\' 9"  (1.753 m)    Eyes: Anicteric no pallor. ENMT: No discharge from the ears eyes nose or mouth. Neck: No mass felt.  No neck rigidity. Respiratory: No rhonchi or crepitations. Cardiovascular: S1-S2 heard. Abdomen: Soft nontender both in person. Musculoskeletal: No edema. Skin: No rash. Neurologic: Alert awake oriented to time place and person but moves all extremities. Psychiatric: Appears normal with normal affect.   Labs on Admission: I have personally reviewed following labs and imaging studies  CBC: Recent Labs  Lab 04/16/19 2106  WBC 5.5  HGB 14.2  HCT 37.9*  MCV 88.3  PLT 60*   Basic Metabolic Panel: Recent Labs  Lab 04/16/19 2106  NA 136  K 2.7*  CL 96*  CO2 23  GLUCOSE 221*  BUN 7  CREATININE 0.57*  CALCIUM 9.5  MG 1.1*   GFR: Estimated Creatinine Clearance: 125.2 mL/min (A) (by C-G formula based on SCr of 0.57 mg/dL (L)). Liver Function Tests: Recent Labs  Lab 04/16/19 2106  AST 224*  ALT 153*  ALKPHOS 87  BILITOT 2.5*  PROT 8.6*  ALBUMIN 4.6   No results for input(s): LIPASE, AMYLASE in the last 168 hours. No results for input(s): AMMONIA in the last 168 hours. Coagulation Profile: No  results for input(s): INR, PROTIME in the last 168 hours. Cardiac Enzymes: No results for input(s): CKTOTAL, CKMB, CKMBINDEX, TROPONINI in the last 168 hours. BNP (last 3 results) No results for input(s): PROBNP in the last 8760 hours. HbA1C: No results for input(s): HGBA1C in the last 72 hours. CBG: No results for input(s): GLUCAP in the last 168 hours. Lipid Profile: No results for input(s): CHOL, HDL, LDLCALC, TRIG, CHOLHDL, LDLDIRECT in the last 72 hours. Thyroid Function Tests: No results for input(s): TSH, T4TOTAL, FREET4, T3FREE, THYROIDAB in the last 72 hours. Anemia Panel: No results for input(s): VITAMINB12, FOLATE,  FERRITIN, TIBC, IRON, RETICCTPCT in the last 72 hours. Urine analysis: No results found for: COLORURINE, APPEARANCEUR, LABSPEC, PHURINE, GLUCOSEU, HGBUR, BILIRUBINUR, KETONESUR, PROTEINUR, UROBILINOGEN, NITRITE, LEUKOCYTESUR Sepsis Labs: @LABRCNTIP (procalcitonin:4,lacticidven:4) )No results found for this or any previous visit (from the past 240 hour(s)).   Radiological Exams on Admission: Ct Head Wo Contrast  Result Date: 04/17/2019 CLINICAL DATA:  38 year old male with altered mental status. EXAM: CT HEAD WITHOUT CONTRAST TECHNIQUE: Contiguous axial images were obtained from the base of the skull through the vertex without intravenous contrast. COMPARISON:  None. FINDINGS: Brain: No midline shift, ventriculomegaly, mass effect, evidence of mass lesion, intracranial hemorrhage or evidence of cortically based acute infarction. Gray-white matter differentiation is within normal limits throughout the brain. Vascular: No suspicious intracranial vascular hyperdensity. Skull: Negative. Sinuses/Orbits: Visualized paranasal sinuses and mastoids are clear. Other: Visualized orbits and scalp soft tissues are within normal limits. IMPRESSION: Normal noncontrast head CT. Electronically Signed   By: Genevie Ann M.D.   On: 04/17/2019 02:20    EKG: Independently reviewed.  Sinus tachycardia.  Assessment/Plan Principal Problem:   Seizure [facility]) Active Problems:   Thrombocytopenia (HCC)   Alcohol abuse   Hypokalemia   Seizures ([place])    1. Seizure likely precipitated by alcohol withdrawal for which patient is placed on CIWA protocol and closely observe. 2. Alcohol abuse on CIWA protocol.  Social work consult.  Advised about quitting. 3. Elevated blood pressure reading.  Likely could be from alcohol withdrawal.  For now we will keep patient on as needed IV labetalol and closely follow blood pressure trends. 4. Hyperglycemia -will check hemoglobin A1c with next blood draw. 5. Hypokalemia cause unclear could be  for nutrition.  Replete and recheck check magnesium. 6. Elevated LFTs pattern consistent with alcoholism.  Follow LFTs.  May need further work-up as outpatient.  Check acute hepatitis panel. 7. Tobacco abuse advised about quitting. 8. Thrombocytopenia likely related to alcoholism.  Follow CBC.  COVID-19 test is pending.   DVT prophylaxis: SCDs due to thrombocytopenia. Code Status: Full code. Family Communication: Patient's girlfriend at the bedside. Disposition Plan: Home. Consults called: None. Admission status: Observation.   Rise Patience MD [facility] Pager [phone].  If 7PM-7AM, please contact night-coverage [email or web address] Password [email or web address]  04/17/2019, 3:18 AM

## 2019-04-18 LAB — CBC
HCT: 34.9 % — ABNORMAL LOW (ref 39.0–52.0)
Hemoglobin: 13.1 g/dL (ref 13.0–17.0)
MCH: 32.1 pg (ref 26.0–34.0)
MCHC: 37.5 g/dL — ABNORMAL HIGH (ref 30.0–36.0)
MCV: 85.5 fL (ref 80.0–100.0)
Platelets: 68 10*3/uL — ABNORMAL LOW (ref 150–400)
RBC: 4.08 MIL/uL — ABNORMAL LOW (ref 4.22–5.81)
RDW: 18.6 % — ABNORMAL HIGH (ref 11.5–15.5)
WBC: 4.3 10*3/uL (ref 4.0–10.5)
nRBC: 0 % (ref 0.0–0.2)

## 2019-04-18 LAB — COMPREHENSIVE METABOLIC PANEL
ALT: 159 U/L — ABNORMAL HIGH (ref 0–44)
AST: 274 U/L — ABNORMAL HIGH (ref 15–41)
Albumin: 3.9 g/dL (ref 3.5–5.0)
Alkaline Phosphatase: 105 U/L (ref 38–126)
Anion gap: 11 (ref 5–15)
BUN: 8 mg/dL (ref 6–20)
CO2: 25 mmol/L (ref 22–32)
Calcium: 9.4 mg/dL (ref 8.9–10.3)
Chloride: 105 mmol/L (ref 98–111)
Creatinine, Ser: 0.59 mg/dL — ABNORMAL LOW (ref 0.61–1.24)
GFR calc Af Amer: >60 mL/min (ref >60)
GFR calc non Af Amer: >60 mL/min (ref >60)
Glucose, Bld: 132 mg/dL — ABNORMAL HIGH (ref 70–99)
Potassium: 3.5 mmol/L (ref 3.5–5.1)
Sodium: 141 mmol/L (ref 135–145)
Total Bilirubin: 1.9 mg/dL — ABNORMAL HIGH (ref 0.3–1.2)
Total Protein: 7.2 g/dL (ref 6.5–8.1)

## 2019-04-18 MED ORDER — THIAMINE HCL 100 MG PO TABS: 100.0000 mg | ORAL_TABLET | Freq: Every day | ORAL | Status: DC

## 2019-04-18 MED ORDER — CHLORDIAZEPOXIDE HCL 25 MG PO CAPS: ORAL_CAPSULE | ORAL | 0 refills | Status: DC

## 2019-04-18 MED ORDER — FOLIC ACID 1 MG PO TABS: 1.0000 mg | ORAL_TABLET | Freq: Every day | ORAL | Status: DC

## 2019-04-18 MED ORDER — GABAPENTIN 400 MG PO CAPS: 400.0000 mg | ORAL_CAPSULE | Freq: Three times a day (TID) | ORAL | 1 refills | Status: DC

## 2019-04-18 NOTE — TOC Initial Note (Signed)
Transition of Care [facility]) - Initial/Assessment Note    Patient Details  Name: Christian [name] MRN: [id number] Date of Birth: Sep 26, 1980  Transition of Care [facility]) CM/SW Contact:    Shelbie Hutching, RN Phone Number: 04/18/2019, 10:08 AM  Clinical Narrative:                 Patient admitted with seizure from alcohol withdrawal.  Per emergency room note patient has not had any alcohol for 4 to 5 days and is trying to stop drinking.  Patient has a history of drinking a significant amount of alcohol per day for many years.  Patient goes to the [facility] and reports that he has been in one of their alcohol treatment programs in the past.   RNCM provided patient with resources for [facility].  [place] application and [facility] application given.  Significant other is at the bedside.   Patient will be discharged today with 2 prescriptions.  Librium is controlled and is not available at [facility].  Librium with Good Rx coupon is $7.38 at at [employer]- coupon printed and given to patient.  Patient and significant other say this is affordable- the other prescription for gabapentin is also available at [facility] with Good Rx coupon for $9.38.     Expected Discharge Plan: Home/Self Care Barriers to Discharge: No Barriers Identified   Patient Goals and CMS Choice Patient states their goals for this hospitalization and ongoing recovery are:: Patient ready to leave the hospital      Expected Discharge Plan and Services Expected Discharge Plan: Home/Self Care In-house Referral: Clinical Social Work Discharge Planning Services: CM Consult   Living arrangements for the past 2 months: Single Family Home Expected Discharge Date: 04/18/19                                    Prior Living Arrangements/Services Living arrangements for the past 2 months: Single Family Home Lives with:: Significant Other Patient language and need for interpreter reviewed:: Yes Do you feel safe going back to the  place where you live?: Yes      Need for Family Participation in Patient Care: Yes (Comment)(alcohol use disorder) Care giver support system in place?: Yes (comment)(significant other)   Criminal Activity/Legal Involvement Pertinent to Current Situation/Hospitalization: No - Comment as needed  Activities of Daily Living   ADL Screening (condition at time of admission) Patient's cognitive ability adequate to safely complete daily activities?: Yes Is the patient deaf or have difficulty hearing?: No Does the patient have difficulty seeing, even when wearing glasses/contacts?: No Does the patient have difficulty concentrating, remembering, or making decisions?: No Patient able to express need for assistance with ADLs?: Yes Does the patient have difficulty dressing or bathing?: No Independently performs ADLs?: Yes (appropriate for developmental age) Does the patient have difficulty walking or climbing stairs?: No Weakness of Legs: None Weakness of Arms/Hands: None  Permission Sought/Granted Permission sought to share information with : Case Manager, Family Supports Permission granted to share information with : Yes, Verbal Permission Granted        Permission granted to share info w Relationship: girlfriend     Emotional Assessment Appearance:: Appears stated age Attitude/Demeanor/Rapport: Engaged Affect (typically observed): Accepting Orientation: : Oriented to Self, Oriented to Place, Oriented to  Time, Oriented to Situation Alcohol / Substance Use: Alcohol Use, Tobacco Use Psych Involvement: No (comment)  Admission diagnosis:  Hypokalemia [E87.[place]  Abnormal lung sounds [R09.89] Alcohol withdrawal seizure with complication (HCC) [Q22.297, R56.9] Seizures (HCC) [R56.9] Patient Active Problem List   Diagnosis Date Noted  . Seizure (HCC) 04/17/2019  . Thrombocytopenia (HCC) 04/17/2019  . Alcohol abuse 04/17/2019  . Hypokalemia 04/17/2019  . Seizures (HCC) 04/17/2019   PCP:   Patient, No Pcp Per Pharmacy:  No Pharmacies Listed    Social Determinants of Health (SDOH) Interventions    Readmission Risk Interventions No flowsheet data found.

## 2019-04-18 NOTE — Progress Notes (Signed)
Discharge instructions given and went over with patient at bedside. All questions answered. Patient discharged home. Alechia Lezama S, RN  

## 2019-04-18 NOTE — Discharge Summary (Signed)
Physician Discharge Summary  Christian [name] VVO:[id number] DOB: Jan 23, 1981 DOA: 04/17/2019  PCP: Patient, No Pcp Per  Admit date: 04/17/2019 Discharge date: 04/18/2019  Admitted From: Home Disposition: Home  Recommendations for Outpatient Follow-up:  1. [facility] 2. [facility] with [facility] as soon as possible 3. [facility]: Please obtain LFTs and CBC in 1 to 2 months     Home Health: None Equipment/Devices: None  Discharge Condition: Fair CODE STATUS: Full Diet recommendation: Regular  Brief/Interim Summary: Christian [name] is a 38 y.o. M with alcohol use disorder who presented after a witnessed seizure.  The patient is interviewing for a new job soon, and so he quit drinking 48 hours prior to admission.  He initially had shakes, malaise, nausea, but these gradually resolved until the day of admission he was witnessed to have a generalized tonic-clonic seizure for several minutes.  In the ER, the patient had another witnessed tonic-clonic seizure, which was aborted with Ativan.  Potassium 2.7, LFTs slightly elevated, alcohol level negative.       PRINCIPAL HOSPITAL DIAGNOSIS: Alcohol withdrawal seizure    Discharge Diagnoses:   Alcohol withdrawal seizure Alcohol use disorder Patient was admitted after prolonged seizure at home, had another GTC seizure witnessed in the ER, aborted with Ativan.    He was started on Librium taper and gabapentin, thiamine and folate.  He was observed in the hospital and had no further seizure activity.  He was asymptomatic the day of discharge, CIWA score 0.  He will finish a brief Librium taper in the next 2 days, should continue      Hypokalemia Hypomagnesemia Repleted   Thrombocytopenia Platelet count 60 K.  This was likely due to alcohol use.  He had no clinical signs of bleeding.  He should follow this up soon as possible with his new PCP.    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  His LFTs are stable, and was improving at the time of discharge, he was asymptomatic.  He should follow this up as soon as possible with his new PCP.  Return precautions given.          Discharge Instructions  Discharge Instructions    Discharge instructions   Complete by: As directed    Take Librium (this is the cousin of Valium) on a quick taper: Take chlordiazepoxide/Librium 25 mg (1 cap) three times daily today (twice more on Friday) then Take chlordiazepoxide/Librium 25 mg (1 cap) twice daily Saturday, morning and night, then  Take chlordiazepoxide/Librium 25 mg (1 cap) Sunday morning and that is it This medicine acts slowly, so will slowly taper out of your system   Take gabapentin 400 mg (1 cap) three times daily for 2 months When you are nearing the end of the second month (when you have 9 pills left), taper down on the gabapentin:     Take twice daily for 3 days then once daily for 3 days then stop   [facility] with the [facility] as soon as you can Ask them to check your liver function tests and your blood counts to check your "LFTs" and your "platelet count"  After your seizure, you may not drive until you are seizure free for 6 months   Increase activity slowly   Complete by: As directed      Allergies as of 04/18/2019   No Known Allergies     Medication List    TAKE these medications  chlordiazePOXIDE 25 MG capsule Commonly known as: LIBRIUM Continue taper: Take Librium 25 mg (1 tab) three times daily today then twice daily tomorrow then once daily then stop   folic acid 1 MG tablet Commonly known as: FOLVITE Take 1 tablet (1 mg total) by mouth daily.   gabapentin 400 MG capsule Commonly known as: Neurontin Take 1 capsule (400 mg total) by mouth 3 (three) times daily.   thiamine 100 MG tablet Take 1 tablet (100 mg total) by mouth daily.      Follow-up Information    Call Center, [facility].    Specialty: General Practice Why: [facility] DEPARTMENT @ [phone] TO [facility] WITH THE [facility]  Contact information: [address] [place] [address] [phone]          No Known Allergies  Consultations:     Procedures/Studies: Ct Head Wo Contrast  Result Date: 04/17/2019 CLINICAL DATA:  38 year old male with altered mental status. EXAM: CT HEAD WITHOUT CONTRAST TECHNIQUE: Contiguous axial images were obtained from the base of the skull through the vertex without intravenous contrast. COMPARISON:  None. FINDINGS: Brain: No midline shift, ventriculomegaly, mass effect, evidence of mass lesion, intracranial hemorrhage or evidence of cortically based acute infarction. Gray-white matter differentiation is within normal limits throughout the brain. Vascular: No suspicious intracranial vascular hyperdensity. Skull: Negative. Sinuses/Orbits: Visualized paranasal sinuses and mastoids are clear. Other: Visualized orbits and scalp soft tissues are within normal limits. IMPRESSION: Normal noncontrast head CT. Electronically Signed   By: Odessa Fleming M.D.   On: 04/17/2019 02:20   Dg Chest Port 1 View  Result Date: 04/17/2019 CLINICAL DATA:  Seizure, abnormal lung sounds, reported history of alcoholism EXAM: PORTABLE CHEST 1 VIEW COMPARISON:  None. FINDINGS: Posterior spinal fusion hardware overlies the upper and lower thoracic spine. Normal heart size. Normal mediastinal contour. No pneumothorax. No pleural effusion. Lungs appear clear, with no acute consolidative airspace disease and no pulmonary edema. IMPRESSION: No active disease. Electronically Signed   By: Delbert Phenix M.D.   On: 04/17/2019 09:30       Subjective: No headache, tremor, nausea, seizure, loss of consciousness, confusion.  No jaundice, vomiting.  Discharge Exam: Vitals:   04/18/19 0525 04/18/19 0831  BP: (!) 118/94 (!) 140/102  Pulse: 93 (!) 110  Resp: 18 18  Temp: 97.6 F (36.4 C) 98.9 F (37.2 C)   SpO2: 98% 98%   Vitals:   04/17/19 1557 04/17/19 1848 04/18/19 0525 04/18/19 0831  BP: 132/78 (!) 124/97 (!) 118/94 (!) 140/102  Pulse: 98 (!) 119 93 (!) 110  Resp: Temp: 99.2 F (37.3 C) 98.6 F (37 C) 97.6 F (36.4 C) 98.9 F (37.2 C)  TempSrc: Oral Oral Oral Oral  SpO2: 100% 99% 98% 98%  Weight:  74.1 kg    Height:   (1.753 m)      General: Pt is alert, awake, not in acute distress Cardiovascular: RRR, nl S1-S2, no murmurs appreciated.   No LE edema.   Respiratory: Normal respiratory rate and rhythm.  CTAB without rales or wheezes. Abdominal: Abdomen soft and non-tender.  No distension or HSM.   Neuro/Psych: Strength symmetric in upper and lower extremities.  Judgment and insight appear normal.  No tremor.   The results of significant diagnostics from this hospitalization (including imaging, microbiology, ancillary and laboratory) are listed below for reference.     Microbiology: Recent Results (from the past 240 hour(s))  SARS CORONAVIRUS 2 (TAT  6-24 HRS) Nasopharyngeal Nasopharyngeal Swab     Status: None   Collection Time: 04/17/19  2:20 AM   Specimen: Nasopharyngeal Swab  Result Value Ref Range Status   SARS Coronavirus 2 NEGATIVE NEGATIVE Final    Comment: (NOTE) SARS-CoV-2 target nucleic acids are NOT DETECTED. The SARS-CoV-2 RNA is generally detectable in upper and lower respiratory specimens during the acute phase of infection. Negative results do not preclude SARS-CoV-2 infection, do not rule out co-infections with other pathogens, and should not be used as the sole basis for treatment or other patient management decisions. Negative results must be combined with clinical observations, patient history, and epidemiological information. The expected result is Negative. Fact Sheet for Patients: SugarRoll.be Fact Sheet for Healthcare Providers: https://www.woods-mathews.com/ This test is not yet  approved or cleared by the [place] FDA and  has been authorized for detection and/or diagnosis of SARS-CoV-2 by FDA under an Emergency Use Authorization (EUA). This EUA will remain  in effect (meaning this test can be used) for the duration of the COVID-19 declaration under Section 56 4(b)(1) of the Act, 21 U.S.C. section 360bbb-3(b)(1), unless the authorization is terminated or revoked sooner. Performed at [facility], [place] [address], [place], [place] [address]      Labs: BNP (last 3 results) No results for input(s): BNP in the last 8760 hours. Basic Metabolic Panel: Recent Labs  Lab 04/16/19 2106 04/17/19 0346 04/18/19 0324  NA 136 137 141  K 2.7* 2.8* 3.5  CL 96* 99 105  CO2 23 26 25   GLUCOSE 221* 129* 132*  BUN 7 7 8   CREATININE 0.57* 0.58* 0.59*  CALCIUM 9.5 9.3 9.4  MG 1.1* 1.5*  --   PHOS  --  1.5*  --    Liver Function Tests: Recent Labs  Lab 04/16/19 2106 04/17/19 0346 04/18/19 0324  AST 224* 162* 274*  ALT 153* 130* 159*  ALKPHOS 87 73 105  BILITOT 2.5* 2.4* 1.9*  PROT 8.6* 7.7 7.2  ALBUMIN 4.6 4.1 3.9   No results for input(s): LIPASE, AMYLASE in the last 168 hours. No results for input(s): AMMONIA in the last 168 hours. CBC: Recent Labs  Lab 04/16/19 2106 04/17/19 0346 04/18/19 0324  WBC 5.5 5.6 4.3  HGB 14.2 13.3 13.1  HCT 37.9* 37.2* 34.9*  MCV 88.3 82.5 85.5  PLT 60* 55* 68*   Cardiac Enzymes: No results for input(s): CKTOTAL, CKMB, CKMBINDEX, TROPONINI in the last 168 hours. BNP: Invalid input(s): POCBNP CBG: No results for input(s): GLUCAP in the last 168 hours. D-Dimer No results for input(s): DDIMER in the last 72 hours. Hgb A1c Recent Labs    04/17/19 0605  HGBA1C 5.5   Lipid Profile No results for input(s): CHOL, HDL, LDLCALC, TRIG, CHOLHDL, LDLDIRECT in the last 72 hours. Thyroid function studies No results for input(s): TSH, T4TOTAL, T3FREE, THYROIDAB in the last 72 hours.  Invalid input(s):  FREET3 Anemia work up Recent Labs    04/17/19 0346  VITAMINB12 287   Urinalysis No results found for: COLORURINE, APPEARANCEUR, LABSPEC, [place], GLUCOSEU, [place], [place], [place], PROTEINUR, UROBILINOGEN, NITRITE, LEUKOCYTESUR Sepsis Labs Invalid input(s): PROCALCITONIN,  WBC,  LACTICIDVEN Microbiology Recent Results (from the past 240 hour(s))  SARS CORONAVIRUS 2 (TAT 6-24 HRS) Nasopharyngeal Nasopharyngeal Swab     Status: None   Collection Time: 04/17/19  2:20 AM   Specimen: Nasopharyngeal Swab  Result Value Ref Range Status   SARS Coronavirus 2 NEGATIVE NEGATIVE Final    Comment: (NOTE) SARS-CoV-2 target  nucleic acids are NOT DETECTED. The SARS-CoV-2 RNA is generally detectable in upper and lower respiratory specimens during the acute phase of infection. Negative results do not preclude SARS-CoV-2 infection, do not rule out co-infections with other pathogens, and should not be used as the sole basis for treatment or other patient management decisions. Negative results must be combined with clinical observations, patient history, and epidemiological information. The expected result is Negative. Fact Sheet for Patients: HairSlick.nohttps://www.fda.gov/media/138098/download Fact Sheet for Healthcare Providers: quierodirigir.comhttps://www.fda.gov/media/138095/download This test is not yet approved or cleared by the [place] FDA and  has been authorized for detection and/or diagnosis of SARS-CoV-2 by FDA under an Emergency Use Authorization (EUA). This EUA will remain  in effect (meaning this test can be used) for the duration of the COVID-19 declaration under Section 56 4(b)(1) of the Act, 21 U.S.C. section 360bbb-3(b)(1), unless the authorization is terminated or revoked sooner. Performed at [facility], [address] [place], [place] [address]      Time coordinating discharge: 25 minutes The [place] controlled substances registry was reviewed for this patient prior to filling the  <5 days supply controlled substances script.      SIGNED:   Alberteen Samhristopher P Karanveer Ramakrishnan, MD  [facility] 04/18/2019, 1:54 PM

## 2019-12-07 ENCOUNTER — Emergency Department
Admission: EM | Admit: 2019-12-07 | Discharge: 2019-12-08 | Disposition: A | Discharge status: Home / Self Care | Payer: 59 | Attending: Emergency Medicine | Admitting: Emergency Medicine

## 2019-12-07 ENCOUNTER — Other Ambulatory Visit: Payer: Self-pay

## 2019-12-07 DIAGNOSIS — F1010 Alcohol abuse, uncomplicated: Secondary | ICD-10-CM

## 2019-12-07 DIAGNOSIS — R259 Unspecified abnormal involuntary movements: Secondary | ICD-10-CM | POA: Diagnosis present

## 2019-12-07 DIAGNOSIS — F10129 Alcohol abuse with intoxication, unspecified: Secondary | ICD-10-CM | POA: Diagnosis not present

## 2019-12-07 DIAGNOSIS — F17210 Nicotine dependence, cigarettes, uncomplicated: Secondary | ICD-10-CM | POA: Diagnosis not present

## 2019-12-07 DIAGNOSIS — F10920 Alcohol use, unspecified with intoxication, uncomplicated: Secondary | ICD-10-CM

## 2019-12-07 LAB — CBC
HCT: 37.1 % — ABNORMAL LOW (ref 39.0–52.0)
Hemoglobin: 13.1 g/dL (ref 13.0–17.0)
MCH: 31 pg (ref 26.0–34.0)
MCHC: 35.3 g/dL (ref 30.0–36.0)
MCV: 87.7 fL (ref 80.0–100.0)
Platelets: 142 10*3/uL — ABNORMAL LOW (ref 150–400)
RBC: 4.23 MIL/uL (ref 4.22–5.81)
RDW: 18 % — ABNORMAL HIGH (ref 11.5–15.5)
WBC: 6.8 10*3/uL (ref 4.0–10.5)
nRBC: 0 % (ref 0.0–0.2)

## 2019-12-07 LAB — COMPREHENSIVE METABOLIC PANEL
ALT: 101 U/L — ABNORMAL HIGH (ref 0–44)
AST: 84 U/L — ABNORMAL HIGH (ref 15–41)
Albumin: 4.4 g/dL (ref 3.5–5.0)
Alkaline Phosphatase: 54 U/L (ref 38–126)
Anion gap: 9 (ref 5–15)
BUN: 15 mg/dL (ref 6–20)
CO2: 25 mmol/L (ref 22–32)
Calcium: 8.1 mg/dL — ABNORMAL LOW (ref 8.9–10.3)
Chloride: 110 mmol/L (ref 98–111)
Creatinine, Ser: 0.77 mg/dL (ref 0.61–1.24)
GFR calc Af Amer: >60 mL/min (ref >60)
GFR calc non Af Amer: >60 mL/min (ref >60)
Glucose, Bld: 96 mg/dL (ref 70–99)
Potassium: 3.5 mmol/L (ref 3.5–5.1)
Sodium: 144 mmol/L (ref 135–145)
Total Bilirubin: 0.6 mg/dL (ref 0.3–1.2)
Total Protein: 7.6 g/dL (ref 6.5–8.1)

## 2019-12-07 NOTE — ED Notes (Signed)
Pt. Transferred from Triage to 20 hall after dressing out and screening for contraband. Pt. Oriented to [place] including Q15 minute rounds as well as Rover and Officer for their protection. Patient is alert and oriented, warm and dry in no acute distress. Patient denies SI, HI, and AVH. Pt. Encouraged to let me know if needs arise.  

## 2019-12-07 NOTE — ED Triage Notes (Signed)
Patient under IVC for SI. Patient reportedly made threats to hurt himself and shoot others. Patient denies SI/HI.

## 2019-12-07 NOTE — ED Notes (Signed)
Patient changed into hospital provided scrubs by this RN and by [employer] officer. Patient's belonging's placed into labeled bag. Patient's belonging's include:  Jeans, shorts, underwear, socks, sandals, belt, shirt, wallet, keys.

## 2019-12-08 ENCOUNTER — Encounter: Payer: Self-pay | Admitting: Psychiatric/Mental Health

## 2019-12-08 DIAGNOSIS — F1010 Alcohol abuse, uncomplicated: Secondary | ICD-10-CM

## 2019-12-08 LAB — SALICYLATE LEVEL: Salicylate Lvl: <7 mg/dL — ABNORMAL LOW (ref 7.0–30.0)

## 2019-12-08 LAB — ACETAMINOPHEN LEVEL: Acetaminophen (Tylenol), Serum: <10 ug/mL — ABNORMAL LOW (ref 10–30)

## 2019-12-08 LAB — ETHANOL: Alcohol, Ethyl (B): 376 mg/dL (ref <10)

## 2019-12-08 MED ORDER — NICOTINE 21 MG/24HR TD PT24
21.0000 mg | MEDICATED_PATCH | Freq: Once | TRANSDERMAL | Status: DC
  Administered 2019-12-08: 21 mg via TRANSDERMAL
  Filled 2019-12-08: qty 1

## 2019-12-08 NOTE — ED Notes (Signed)
Hourly rounding reveals patient sleeping in hall bed. No complaints, stable, in no acute distress. Q15 minute rounds and monitoring via Rover and Officer to continue.  

## 2019-12-08 NOTE — ED Provider Notes (Signed)
[facility] Emergency Department Provider Note  ____________________________________________   First MD Initiated Contact with Patient 12/08/19 0007     (approximate)  I have reviewed the triage vital signs and the nursing notes.   HISTORY  Chief Complaint Suicidal    HPI Christian [name] is a 39 y.o. male with below list of previous medical conditions including seizure alcohol abuse thrombocytopenia presents to the emergency department involuntarily committed secondary to alcohol intoxication with stated homicidal and suicidal ideation.  Patient currently denies any homicidal or suicidal ideation.  Patient does admit to drinking heavily today.        History reviewed. No pertinent past medical history.  Patient Active Problem List   Diagnosis Date Noted  . Seizure (HCC) 04/17/2019  . Thrombocytopenia (HCC) 04/17/2019  . ETOH abuse 04/17/2019  . Hypokalemia 04/17/2019  . Seizures (HCC) 04/17/2019    Past Surgical History:  Procedure Laterality Date  . BACK SURGERY      Prior to Admission medications   Medication Sig Start Date End Date Taking? Authorizing Provider  chlordiazePOXIDE (LIBRIUM) 25 MG capsule Continue taper: Take Librium 25 mg (1 tab) three times daily today then twice daily tomorrow then once daily then stop 04/18/19   Danford, Earl Lites, MD  folic acid (FOLVITE) 1 MG tablet Take 1 tablet (1 mg total) by mouth daily. 04/18/19   Danford, Earl Lites, MD  gabapentin (NEURONTIN) 400 MG capsule Take 1 capsule (400 mg total) by mouth 3 (three) times daily. 04/18/19 06/17/19  Danford, Earl Lites, MD  thiamine 100 MG tablet Take 1 tablet (100 mg total) by mouth daily. 04/18/19   Danford, Earl Lites, MD    Allergies Patient has no known allergies.  Family History  Problem Relation Age of Onset  . Seizures Neg Hx     Social History Social History   Tobacco Use  . Smoking status: Current Every Day Smoker  . Smokeless  tobacco: Never Used  Substance Use Topics  . Alcohol use: Yes  . Drug use: Yes    Types: Marijuana    Review of Systems Constitutional: No fever/chills Eyes: No visual changes. ENT: No sore throat. Cardiovascular: Denies chest pain. Respiratory: Denies shortness of breath. Gastrointestinal: No abdominal pain.  No nausea, no vomiting.  No diarrhea.  No constipation. Genitourinary: Negative for dysuria. Musculoskeletal: Negative for neck pain.  Negative for back pain. Integumentary: Negative for rash. Neurological: Negative for headaches, focal weakness or numbness. Psychiatric:  Positive for alcohol intoxication   ____________________________________________   PHYSICAL EXAM:  VITAL SIGNS: ED Triage Vitals [12/07/19 2313]  Enc Vitals Group     BP (!) 150/106     Pulse Rate (!) 129     Resp 18     Temp 99.3 F (37.4 C)     Temp src      SpO2 98 %     Weight 74.1 kg (163 lb 5.8 oz)     Height 1.753 m (5\' 9" )     Head Circumference      Peak Flow      Pain Score 0     Pain Loc      Pain Edu?      Excl. in GC?     Constitutional: Alert and oriented.  Appears intoxicated Eyes: Conjunctivae are normal.  Mouth/Throat: Patient is wearing a mask. Neck: No stridor.  No meningeal signs.   Cardiovascular: Normal rate, regular rhythm. Good peripheral circulation. Grossly normal heart sounds. Respiratory: Normal respiratory  effort.  No retractions. Gastrointestinal: Soft and nontender. No distention.  Musculoskeletal: No lower extremity tenderness nor edema. No gross deformities of extremities. Neurologic:  Normal speech and language. No gross focal neurologic deficits are appreciated.  Skin:  Skin is warm, dry and intact. Psychiatric: Mood and affect are normal. Speech and behavior are normal.  ____________________________________________   LABS (all labs ordered are listed, but only abnormal results are displayed)  Labs Reviewed  COMPREHENSIVE METABOLIC PANEL -  Abnormal; Notable for the following components:      Result Value   Calcium 8.1 (*)    AST 84 (*)    ALT 101 (*)    All other components within normal limits  ETHANOL - Abnormal; Notable for the following components:   Alcohol, Ethyl (B) 376 (*)    All other components within normal limits  SALICYLATE LEVEL - Abnormal; Notable for the following components:   Salicylate Lvl <7.0 (*)    All other components within normal limits  ACETAMINOPHEN LEVEL - Abnormal; Notable for the following components:   Acetaminophen (Tylenol), Serum <10 (*)    All other components within normal limits  CBC - Abnormal; Notable for the following components:   HCT 37.1 (*)    RDW 18.0 (*)    Platelets 142 (*)    All other components within normal limits  URINE DRUG SCREEN, QUALITATIVE ([facility] ONLY)   ____  Procedures   ____________________________________________   INITIAL IMPRESSION / MDM / ASSESSMENT AND PLAN / ED COURSE  As part of my medical decision making, I reviewed the following data within the electronic MEDICAL RECORD NUMBER   39 year old male presented with above-stated history and physical exam consistent with alcohol intoxication.  Involuntary commitment papers states that the patient voiced homicidal and suicidal ideation.  Patient denies any homicidal or suicidal ideation in the emergency department.  Patient evaluated by the psychiatry staff with recommendation to discharge the patient when he was sober.  Patient reassessed before discharge ambulating without difficulty.  Patient again asked regarding homicidal or suicidal ideation to which the patient denied both.  Patient was also offered alcohol detox which he refused stating that he will be going to the [place] for his detox.  ____________________________________________  FINAL CLINICAL IMPRESSION(S) / ED DIAGNOSES  Final diagnoses:  Alcoholic intoxication without complication (HCC)     MEDICATIONS GIVEN DURING THIS VISIT:  Medications   nicotine (NICODERM CQ - dosed in mg/24 hours) patch 21 mg (21 mg Transdermal Patch Applied 12/08/19 0136)     ED Discharge Orders    None      *Please note:  Bradden Tadros was evaluated in Emergency Department on 12/08/2019 for the symptoms described in the history of present illness. He was evaluated in the context of the global COVID-19 pandemic, which necessitated consideration that the patient might be at risk for infection with the SARS-CoV-2 virus that causes COVID-19. Institutional protocols and algorithms that pertain to the evaluation of patients at risk for COVID-19 are in a state of rapid change based on information released by regulatory bodies including the CDC and federal and state organizations. These policies and algorithms were followed during the patient's care in the ED.  Some ED evaluations and interventions may be delayed as a result of limited staffing during and after the pandemic.*  Note:  This document was prepared using Dragon voice recognition software and may include unintentional dictation errors.   Darci Current, MD 12/08/19 [phone]

## 2019-12-08 NOTE — Consult Note (Signed)
[facility] Face-to-Face Psychiatry Consult   Reason for Consult: Psych evaluation Referring Physician: Dr. Manson Passey Patient Identification: Christian [name] MRN:  [id number] Principal Diagnosis: ETOH abuse Diagnosis:  Principal Problem:   ETOH abuse   Total Time spent with patient: 30 minutes  Subjective:  " I never said I wanted to kl myself or shoot someone else"  HPI:  Christian [name], 39 y.o., male patient seen face to face  by this provider; chart reviewed and consulted with Dr. Lucianne Muss on 12/08/19.  On evaluation [name] reports reports that he was having a lot to drink and that he never made statements of need to kill himself.  He states that someone called the police because he stated that his best friend wants to kill him.  He cannot the story he still appears to be severely inebriated.  His current BAL is 376.  He states he has quite a few drinks tonight, mainly tequila.  Concern at this point is being able to get to work tomorrow at 8 AM as he states he is still in the probationary period.  Discussed situation with Dr. Manson Passey, informed Dr. Manson Passey of  writer's opinion is that patient is psychiatrically cleared, however he needs to remain in the hospital for a few more so than his blood alcohol level may be decreased before being discharged.  Dr. Manson Passey agreed and we both agree on a discharge time of 6 AM to enable the patient to be at work at 8am.  Patient is aware of the plan and is in agreement.  Patient has requested to use his phone to call his girlfriend, to begin making arrangements to get to work tomorrow.  During evaluation Treavor Blomquist is sleeping and is difficult to arouse; he is alert/oriented x 4; calm/cooperative; and mood congruent with affect.  Patient is speaking in a clear tone at moderate volume, and normal pace; with good eye contact.  His  thought process is coherent and relevant; There is no indication that he  is currently responding to internal/external stimuli or experiencing  delusional thought content.  Patient denies suicidal/self-harm/homicidal ideation, psychosis, and paranoia.  Patient has remained calm throughout assessment and has answered questions appropriately.   Past Psychiatric History: NA  Risk to Self:   Risk to Others:   Prior Inpatient Therapy:   Prior Outpatient Therapy:    Past Medical History: History reviewed. No pertinent past medical history.  Past Surgical History:  Procedure Laterality Date  . BACK SURGERY     Family History:  Family History  Problem Relation Age of Onset  . Seizures Neg Hx    Family Psychiatric  History: unknown Social History:  Social History   Substance and Sexual Activity  Alcohol Use Yes     Social History   Substance and Sexual Activity  Drug Use Yes  . Types: Marijuana    Social History   Socioeconomic History  . Marital status: Single    Spouse name: Not on file  . Number of children: Not on file  . Years of education: Not on file  . Highest education level: Not on file  Occupational History  . Not on file  Tobacco Use  . Smoking status: Current Every Day Smoker  . Smokeless tobacco: Never Used  Substance and Sexual Activity  . Alcohol use: Yes  . Drug use: Yes    Types: Marijuana  . Sexual activity: Not on file  Other Topics Concern  . Not on file  Social History Narrative  .  Not on file   Social Determinants of Health   Financial Resource Strain:   . Difficulty of Paying Living Expenses:   Food Insecurity:   . Worried About Programme researcher, broadcasting/film/videounning Out of Food in the Last Year:   . Baristaan Out of Food in the Last Year:   Transportation Needs:   . Freight forwarderLack of Transportation (Medical):   Marland Kitchen. Lack of Transportation (Non-Medical):   Physical Activity:   . Days of Exercise per Week:   . Minutes of Exercise per Session:   Stress:   . Feeling of Stress :   Social Connections:   . Frequency of Communication with Friends and Family:   . Frequency of Social Gatherings with Friends and Family:   .  Attends Religious Services:   . Active Member of Clubs or Organizations:   . Attends BankerClub or Organization Meetings:   Marland Kitchen. Marital Status:    Additional Social History:    Allergies:  No Known Allergies  Labs:  Results for orders placed or performed during the hospital encounter of 12/07/19 (from the past 48 hour(s))  Comprehensive metabolic panel     Status: Abnormal   Collection Time: 12/07/19 11:20 PM  Result Value Ref Range   Sodium 144 135 - 145 mmol/L   Potassium 3.5 3.5 - 5.1 mmol/L   Chloride 110 98 - 111 mmol/L   CO2 25 22 - 32 mmol/L   Glucose, Bld 96 70 - 99 mg/dL    Comment: Glucose reference range applies only to samples taken after fasting for at least 8 hours.   BUN 15 6 - 20 mg/dL   Creatinine, Ser 9.140.77 0.61 - 1.24 mg/dL   Calcium 8.1 (L) 8.9 - 10.3 mg/dL   Total Protein 7.6 6.5 - 8.1 g/dL   Albumin 4.4 3.5 - 5.0 g/dL   AST 84 (H) 15 - 41 U/L   ALT 101 (H) 0 - 44 U/L   Alkaline Phosphatase 54 38 - 126 U/L   Total Bilirubin 0.6 0.3 - 1.2 mg/dL   GFR calc non Af Amer >60 >60 mL/min   GFR calc Af Amer >60 >60 mL/min   Anion gap 9 5 - 15    Comment: Performed at [facility], [address] [place], [place] [address]  Ethanol     Status: Abnormal   Collection Time: 12/07/19 11:20 PM  Result Value Ref Range   Alcohol, Ethyl (B) 376 (HH) <10 mg/dL    Comment: CRITICAL RESULT CALLED TO, READ BACK BY AND VERIFIED WITH DR.BROWN AT 0001 ON 12/08/19 [facility] (NOTE) Lowest detectable limit for serum alcohol is 10 mg/dL.  For medical purposes only. Performed at [facility], [address] [place], [place] [address]   Salicylate level     Status: Abnormal   Collection Time: 12/07/19 11:20 PM  Result Value Ref Range   Salicylate Lvl <7.0 (L) 7.0 - 30.0 mg/dL    Comment: Performed at [facility], [address] [place], [place] [address]  Acetaminophen level     Status: Abnormal   Collection Time: 12/07/19 11:20 PM  Result Value Ref  Range   Acetaminophen (Tylenol), Serum <10 (L) 10 - 30 ug/mL    Comment: (NOTE) Therapeutic concentrations vary significantly. A range of 10-30 ug/mL  may be an effective concentration for many patients. However, some  are best treated at concentrations outside of this range. Acetaminophen concentrations >150 ug/mL at 4 hours after ingestion  and >50 ug/mL at 12 hours after ingestion  are often associated with  toxic reactions.  Performed at [facility], [address] [place], [place] [address]   cbc     Status: Abnormal   Collection Time: 12/07/19 11:20 PM  Result Value Ref Range   WBC 6.8 4.0 - 10.5 K/uL   RBC 4.23 4.22 - 5.81 MIL/uL   Hemoglobin 13.1 13.0 - 17.0 g/dL   HCT 18.8 (L) 39 - 52 %   MCV 87.7 80.0 - 100.0 fL   MCH 31.0 26.0 - 34.0 pg   MCHC 35.3 30.0 - 36.0 g/dL   RDW 41.6 (H) 60.6 - 30.1 %   Platelets 142 (L) 150 - 400 K/uL   nRBC 0.0 0.0 - 0.2 %    Comment: Performed at [facility], [address], [place], [place] [address]    Current Facility-Administered Medications  Medication Dose Route Frequency Provider Last Rate Last Admin  . nicotine (NICODERM CQ - dosed in mg/24 hours) patch 21 mg  21 mg Transdermal Once Darci Current, MD       Current Outpatient Medications  Medication Sig Dispense Refill  . chlordiazePOXIDE (LIBRIUM) 25 MG capsule Continue taper: Take Librium 25 mg (1 tab) three times daily today then twice daily tomorrow then once daily then stop 5 capsule 0  . folic acid (FOLVITE) 1 MG tablet Take 1 tablet (1 mg total) by mouth daily.    Marland Kitchen gabapentin (NEURONTIN) 400 MG capsule Take 1 capsule (400 mg total) by mouth 3 (three) times daily. 90 capsule 1  . thiamine 100 MG tablet Take 1 tablet (100 mg total) by mouth daily.      Musculoskeletal: Strength & Muscle Tone: within normal limits Gait & Station: unsteady Patient leans: N/A  Psychiatric Specialty Exam: Physical Exam Vitals reviewed.  Constitutional:       Appearance: Normal appearance. He is normal weight.  HENT:     Head: Normocephalic.     Nose: Nose normal.  Eyes:     Pupils: Pupils are equal, round, and reactive to light.  Cardiovascular:     Pulses: Normal pulses.  Pulmonary:     Effort: Pulmonary effort is normal.  Musculoskeletal:        General: Normal range of motion.     Cervical back: Normal range of motion.  Skin:    General: Skin is warm and dry.  Psychiatric:        Attention and Perception: Attention and perception normal.        Mood and Affect: Mood and affect normal.        Speech: Speech is delayed.        Behavior: Behavior normal. Behavior is cooperative.        Thought Content: Thought content normal.        Cognition and Memory: Cognition normal.        Judgment: Judgment normal.     Review of Systems  Psychiatric/Behavioral: Positive for confusion. Negative for self-injury, sleep disturbance and suicidal ideas.  All other systems reviewed and are negative.   Blood pressure (!) 150/106, pulse (!) 129, temperature 99.3 F (37.4 C), resp. rate 18, height 5\' 9"  (1.753 m), weight 74.1 kg, SpO2 98 %.Body mass index is 24.12 kg/m.  General Appearance: Disheveled  Eye Contact:  Good  Speech:  Clear and Coherent  Volume:  Normal  Mood:  Anxious  Affect:  Appropriate and Congruent  Thought Process:  Coherent and Descriptions of Associations: Intact  Orientation:  Full (Time,  Place, and Person)  Thought Content:  WDL and Logical  Suicidal Thoughts:  No  Homicidal Thoughts:  No  Memory:  Immediate;   Fair  Judgement:  Fair  Insight:  Fair  Psychomotor Activity:  Normal  Concentration:  Concentration: Fair  Recall:  Fair  Fund of Knowledge:  Good  Language:  Good  Akathisia:  NA  Handed:  Right  AIMS (if indicated):     Assets:  Architect Physical Health Transportation  ADL's:  Intact  Cognition:  WNL  Sleep:       Disposition: No evidence of imminent  risk to self or others at present.   Patient does not meet criteria for psychiatric inpatient admission.  Jearld Lesch, NP 12/08/2019 1:32 AM

## 2019-12-08 NOTE — ED Notes (Signed)
Hourly rounding reveals patient sleeping in room. No complaints, stable, in no acute distress. Q15 minute rounds and monitoring via Rover and Officer to continue.  

## 2019-12-08 NOTE — ED Notes (Addendum)
Hourly rounding reveals patient sleeping in hall bed. No complaints, stable, in no acute distress. Q15 minute rounds and monitoring via Rover and Officer to continue.  

## 2019-12-08 NOTE — ED Notes (Signed)
Hourly rounding reveals patient awake in hall bed. No complaints, stable, in no acute distress. Q15 minute rounds and monitoring via Rover and Officer to continue.  

## 2020-08-23 ENCOUNTER — Observation Stay
Admission: EM | Admit: 2020-08-23 | Discharge: 2020-08-24 | Disposition: A | Discharge status: Home / Self Care | Payer: BC Managed Care – PPO | Attending: Internal Medicine | Admitting: Internal Medicine

## 2020-08-23 DIAGNOSIS — R569 Unspecified convulsions: Secondary | ICD-10-CM | POA: Diagnosis not present

## 2020-08-23 DIAGNOSIS — K7010 Alcoholic hepatitis without ascites: Secondary | ICD-10-CM | POA: Insufficient documentation

## 2020-08-23 DIAGNOSIS — Z20822 Contact with and (suspected) exposure to covid-19: Secondary | ICD-10-CM | POA: Insufficient documentation

## 2020-08-23 DIAGNOSIS — F17210 Nicotine dependence, cigarettes, uncomplicated: Secondary | ICD-10-CM | POA: Insufficient documentation

## 2020-08-23 DIAGNOSIS — D696 Thrombocytopenia, unspecified: Secondary | ICD-10-CM | POA: Diagnosis not present

## 2020-08-23 DIAGNOSIS — F1010 Alcohol abuse, uncomplicated: Secondary | ICD-10-CM | POA: Diagnosis not present

## 2020-08-23 DIAGNOSIS — F10939 Alcohol use, unspecified with withdrawal, unspecified: Secondary | ICD-10-CM

## 2020-08-23 DIAGNOSIS — F10239 Alcohol dependence with withdrawal, unspecified: Secondary | ICD-10-CM

## 2020-08-23 LAB — CBC WITH DIFFERENTIAL/PLATELET
Abs Immature Granulocytes: 0.05 10*3/uL (ref 0.00–0.07)
Basophils Absolute: 0 10*3/uL (ref 0.0–0.1)
Basophils Relative: 0 %
Eosinophils Absolute: 0 10*3/uL (ref 0.0–0.5)
Eosinophils Relative: 1 %
HCT: 37 % — ABNORMAL LOW (ref 39.0–52.0)
Hemoglobin: 13.5 g/dL (ref 13.0–17.0)
Immature Granulocytes: 1 %
Lymphocytes Relative: 27 %
Lymphs Abs: 1.2 10*3/uL (ref 0.7–4.0)
MCH: 32.7 pg (ref 26.0–34.0)
MCHC: 36.5 g/dL — ABNORMAL HIGH (ref 30.0–36.0)
MCV: 89.6 fL (ref 80.0–100.0)
Monocytes Absolute: 0.5 10*3/uL (ref 0.1–1.0)
Monocytes Relative: 13 %
Neutro Abs: 2.5 10*3/uL (ref 1.7–7.7)
Neutrophils Relative %: 58 %
Platelets: 64 10*3/uL — ABNORMAL LOW (ref 150–400)
RBC: 4.13 MIL/uL — ABNORMAL LOW (ref 4.22–5.81)
RDW: 18.3 % — ABNORMAL HIGH (ref 11.5–15.5)
WBC: 4.2 10*3/uL (ref 4.0–10.5)
nRBC: 0 % (ref 0.0–0.2)

## 2020-08-23 LAB — URINE DRUG SCREEN, QUALITATIVE (ARMC ONLY)
Amphetamines, Ur Screen: NOT DETECTED
Barbiturates, Ur Screen: NOT DETECTED
Benzodiazepine, Ur Scrn: NOT DETECTED
Cannabinoid 50 Ng, Ur ~~LOC~~: NOT DETECTED
Cocaine Metabolite,Ur ~~LOC~~: NOT DETECTED
MDMA (Ecstasy)Ur Screen: NOT DETECTED
Methadone Scn, Ur: NOT DETECTED
Opiate, Ur Screen: NOT DETECTED
Phencyclidine (PCP) Ur S: NOT DETECTED
Tricyclic, Ur Screen: NOT DETECTED

## 2020-08-23 LAB — COMPREHENSIVE METABOLIC PANEL
ALT: 93 U/L — ABNORMAL HIGH (ref 0–44)
AST: 161 U/L — ABNORMAL HIGH (ref 15–41)
Albumin: 4.1 g/dL (ref 3.5–5.0)
Alkaline Phosphatase: 57 U/L (ref 38–126)
Anion gap: 22 — ABNORMAL HIGH (ref 5–15)
BUN: 5 mg/dL — ABNORMAL LOW (ref 6–20)
CO2: 16 mmol/L — ABNORMAL LOW (ref 22–32)
Calcium: 8.7 mg/dL — ABNORMAL LOW (ref 8.9–10.3)
Chloride: 96 mmol/L — ABNORMAL LOW (ref 98–111)
Creatinine, Ser: 0.86 mg/dL (ref 0.61–1.24)
GFR, Estimated: >60 mL/min (ref >60)
Glucose, Bld: 166 mg/dL — ABNORMAL HIGH (ref 70–99)
Potassium: 3.7 mmol/L (ref 3.5–5.1)
Sodium: 134 mmol/L — ABNORMAL LOW (ref 135–145)
Total Bilirubin: 1.1 mg/dL (ref 0.3–1.2)
Total Protein: 7.4 g/dL (ref 6.5–8.1)

## 2020-08-23 LAB — BASIC METABOLIC PANEL
Anion gap: 8 (ref 5–15)
BUN: <5 mg/dL — ABNORMAL LOW (ref 6–20)
CO2: 27 mmol/L (ref 22–32)
Calcium: 8.5 mg/dL — ABNORMAL LOW (ref 8.9–10.3)
Chloride: 100 mmol/L (ref 98–111)
Creatinine, Ser: 0.64 mg/dL (ref 0.61–1.24)
GFR, Estimated: >60 mL/min (ref >60)
Glucose, Bld: 102 mg/dL — ABNORMAL HIGH (ref 70–99)
Potassium: 3 mmol/L — ABNORMAL LOW (ref 3.5–5.1)
Sodium: 135 mmol/L (ref 135–145)

## 2020-08-23 LAB — ETHANOL: Alcohol, Ethyl (B): 30 mg/dL — ABNORMAL HIGH (ref <10)

## 2020-08-23 LAB — RESP PANEL BY RT-PCR (FLU A&B, COVID) ARPGX2
Influenza A by PCR: NEGATIVE
Influenza B by PCR: NEGATIVE
SARS Coronavirus 2 by RT PCR: NEGATIVE

## 2020-08-23 LAB — LIPASE, BLOOD: Lipase: 42 U/L (ref 11–51)

## 2020-08-23 LAB — MAGNESIUM: Magnesium: 1.4 mg/dL — ABNORMAL LOW (ref 1.7–2.4)

## 2020-08-23 MED ORDER — LORAZEPAM 2 MG/ML IJ SOLN: 0.0000 mg | Freq: Two times a day (BID) | INTRAMUSCULAR | Status: DC

## 2020-08-23 MED ORDER — GABAPENTIN 400 MG PO CAPS
400.0000 mg | ORAL_CAPSULE | Freq: Three times a day (TID) | ORAL | Status: DC
  Administered 2020-08-23 – 2020-08-24 (×2): 400 mg via ORAL
  Filled 2020-08-23 (×2): qty 1

## 2020-08-23 MED ORDER — SODIUM CHLORIDE 0.9 % IV SOLN
75.0000 mL/h | INTRAVENOUS | Status: DC
  Administered 2020-08-23 – 2020-08-24 (×2): 75 mL/h via INTRAVENOUS

## 2020-08-23 MED ORDER — LORAZEPAM 2 MG/ML IJ SOLN
2.0000 mg | Freq: Once | INTRAMUSCULAR | Status: AC
  Administered 2020-08-23: 2 mg via INTRAVENOUS
  Filled 2020-08-23: qty 1

## 2020-08-23 MED ORDER — ACETAMINOPHEN 325 MG PO TABS: 650.0000 mg | ORAL_TABLET | ORAL | Status: DC | PRN

## 2020-08-23 MED ORDER — THIAMINE HCL 100 MG/ML IJ SOLN
Freq: Once | INTRAVENOUS | Status: AC
  Filled 2020-08-23: qty 1000

## 2020-08-23 MED ORDER — ACETAMINOPHEN 650 MG RE SUPP: 650.0000 mg | RECTAL | Status: DC | PRN

## 2020-08-23 MED ORDER — ONDANSETRON HCL 4 MG/2ML IJ SOLN: 4.0000 mg | Freq: Four times a day (QID) | INTRAMUSCULAR | Status: DC | PRN

## 2020-08-23 MED ORDER — LORAZEPAM 2 MG PO TABS: 0.0000 mg | ORAL_TABLET | Freq: Four times a day (QID) | ORAL | Status: DC

## 2020-08-23 MED ORDER — ONDANSETRON HCL 4 MG PO TABS: 4.0000 mg | ORAL_TABLET | Freq: Four times a day (QID) | ORAL | Status: DC | PRN

## 2020-08-23 MED ORDER — LORAZEPAM 2 MG/ML IJ SOLN
0.0000 mg | Freq: Four times a day (QID) | INTRAMUSCULAR | Status: DC
  Administered 2020-08-23: 1 mg via INTRAVENOUS
  Filled 2020-08-23 (×2): qty 1

## 2020-08-23 MED ORDER — THIAMINE HCL 100 MG PO TABS
100.0000 mg | ORAL_TABLET | Freq: Every day | ORAL | Status: DC
  Administered 2020-08-23 – 2020-08-24 (×2): 100 mg via ORAL

## 2020-08-23 MED ORDER — THIAMINE HCL 100 MG PO TABS
50.0000 mg | ORAL_TABLET | Freq: Once | ORAL | Status: AC
  Administered 2020-08-23: 50 mg via ORAL
  Filled 2020-08-23: qty 1

## 2020-08-23 MED ORDER — THIAMINE HCL 100 MG PO TABS
100.0000 mg | ORAL_TABLET | Freq: Every day | ORAL | Status: DC
  Filled 2020-08-23 (×2): qty 1

## 2020-08-23 MED ORDER — LORAZEPAM 2 MG PO TABS: 0.0000 mg | ORAL_TABLET | Freq: Two times a day (BID) | ORAL | Status: DC

## 2020-08-23 MED ORDER — FOLIC ACID 1 MG PO TABS
1.0000 mg | ORAL_TABLET | Freq: Every day | ORAL | Status: DC
  Administered 2020-08-23 – 2020-08-24 (×2): 1 mg via ORAL
  Filled 2020-08-23 (×2): qty 1

## 2020-08-23 MED ORDER — THIAMINE HCL 100 MG PO TABS: 100.0000 mg | ORAL_TABLET | Freq: Every day | ORAL | Status: DC

## 2020-08-23 MED ORDER — THIAMINE HCL 100 MG/ML IJ SOLN
100.0000 mg | Freq: Every day | INTRAMUSCULAR | Status: DC
  Administered 2020-08-23: 100 mg via INTRAVENOUS
  Filled 2020-08-23: qty 2

## 2020-08-23 MED ORDER — SODIUM CHLORIDE 0.9 % IV BOLUS
1000.0000 mL | Freq: Once | INTRAVENOUS | Status: AC
  Administered 2020-08-23: 1000 mL via INTRAVENOUS

## 2020-08-23 NOTE — ED Notes (Signed)
Per MD, RN is to give 1mg  of Ativan at this time.

## 2020-08-23 NOTE — ED Provider Notes (Addendum)
[facility] Emergency Department Provider Note   ____________________________________________   Event Date/Time   First MD Initiated Contact with Patient 08/23/20 1701     (approximate)  I have reviewed the triage vital signs and the nursing notes.   HISTORY  Chief Complaint Seizures    HPI Christian [name] is a 40 y.o. male here for evaluation after a seizure  Patient was helping someone jumpstart a car, the car was already running and he was not touching the car or electrical cables when they noted that he suddenly had a about 2-minute seizure.  EMS arrived reports he was postictal for about 10 minutes but no active seizure-like activity on arrival  Patient does report he had a distant history of a seizure was put on a medicine to help him wean off alcohol at the time  Had 1 previous seizure in his life and it was associated with stopping alcohol  He does report he last had a drink about 5 days ago normally drinks tequila generally 1/5 size bottles with variable amounts day-to-day  He does feel shaky at this time.  No headache no chest pain denies any falls or injuries.  Feels well except he feels little bit fatigued lightheaded and tremulous  Denies recent illness.  In his normal state of health up until this occurred   No past medical history on file.  Patient Active Problem List   Diagnosis Date Noted  . Seizure (HCC) 04/17/2019  . Thrombocytopenia (HCC) 04/17/2019  . ETOH abuse 04/17/2019  . Hypokalemia 04/17/2019  . Seizures (HCC) 04/17/2019    Past Surgical History:  Procedure Laterality Date  . BACK SURGERY      Prior to Admission medications   Medication Sig Start Date End Date Taking? Authorizing Provider  chlordiazePOXIDE (LIBRIUM) 25 MG capsule Continue taper: Take Librium 25 mg (1 tab) three times daily today then twice daily tomorrow then once daily then stop 04/18/19   Danford, Earl Lites, MD  folic acid (FOLVITE) 1 MG  tablet Take 1 tablet (1 mg total) by mouth daily. 04/18/19   Danford, Earl Lites, MD  gabapentin (NEURONTIN) 400 MG capsule Take 1 capsule (400 mg total) by mouth 3 (three) times daily. 04/18/19 06/17/19  Danford, Earl Lites, MD  thiamine 100 MG tablet Take 1 tablet (100 mg total) by mouth daily. 04/18/19   Danford, Earl Lites, MD    Allergies Patient has no known allergies.  Family History  Problem Relation Age of Onset  . Seizures Neg Hx     Social History Social History   Tobacco Use  . Smoking status: Current Every Day Smoker  . Smokeless tobacco: Never Used  Substance Use Topics  . Alcohol use: Yes  . Drug use: Yes    Types: Marijuana    Review of Systems Constitutional: No fever/chills Eyes: No visual changes. ENT: No sore throat. Cardiovascular: Denies chest pain. Respiratory: Denies shortness of breath. Gastrointestinal: No abdominal pain.   Musculoskeletal: Negative for back pain. Skin: Negative for rash. Neurological: Negative for headaches, areas of focal weakness or numbness. Denies drug abuse.  Does utilize alcohol up until 5 days ago   ____________________________________________   PHYSICAL EXAM:  VITAL SIGNS: ED Triage Vitals  Enc Vitals Group     BP 08/23/20 1700 (!) 149/108     Pulse Rate 08/23/20 1700 (!) 131     Resp 08/23/20 1700 16     Temp 08/23/20 1705 98.3 F (36.8 C)  Temp Source 08/23/20 1705 Oral     SpO2 08/23/20 1700 99 %     Weight 08/23/20 1701 170 lb (77.1 kg)     Height 08/23/20 1701 5\' 9"  (1.753 m)     Head Circumference --      Peak Flow --      Pain Score 08/23/20 1701 0     Pain Loc --      Pain Edu? --      Excl. in GC? --     Constitutional: Alert and oriented.  Mildly ill-appearing, slightly tremulous little bit diaphoretic.  He appears just slightly anxious. Eyes: Conjunctivae are mildly injected bilateral. Head: Atraumatic. Nose: No congestion/rhinnorhea. Mouth/Throat: Mucous membranes are  moist. Neck: No stridor.  Cardiovascular: Modestly tachycardic rate, regular rhythm. Grossly normal heart sounds.  Good peripheral circulation. Respiratory: Normal respiratory effort.  No retractions. Lungs CTAB. Gastrointestinal: Soft and nontender. No distention. Musculoskeletal: No lower extremity tenderness nor edema.  Normal examination cranial nerves.  Normal extraocular movements.  5-5 strength in all extremities.  No acute neurologic deficits noted.  He does however demonstrate tremulousness in both upper extremities. Neurologic:  Normal speech and language. No gross focal neurologic deficits are appreciated.  Skin:  Skin is warm, slightly diaphoretic and intact. No rash noted. Psychiatric: Mood and affect are mildly anxious. Speech and behavior are normal.  Clear speech without dysarthria or slurring  ____________________________________________   LABS (all labs ordered are listed, but only abnormal results are displayed)  Labs Reviewed  COMPREHENSIVE METABOLIC PANEL - Abnormal; Notable for the following components:      Result Value   Sodium 134 (*)    Chloride 96 (*)    CO2 16 (*)    Glucose, Bld 166 (*)    BUN 5 (*)    Calcium 8.7 (*)    AST 161 (*)    ALT 93 (*)    Anion gap 22 (*)    All other components within normal limits  CBC WITH DIFFERENTIAL/PLATELET - Abnormal; Notable for the following components:   RBC 4.13 (*)    HCT 37.0 (*)    MCHC 36.5 (*)    RDW 18.3 (*)    Platelets 64 (*)    All other components within normal limits  ETHANOL - Abnormal; Notable for the following components:   Alcohol, Ethyl (B) 30 (*)    All other components within normal limits  MAGNESIUM - Abnormal; Notable for the following components:   Magnesium 1.4 (*)    All other components within normal limits  RESP PANEL BY RT-PCR (FLU A&B, COVID) ARPGX2  LIPASE, BLOOD  URINE DRUG SCREEN, QUALITATIVE ([facility] ONLY)   ____________________________________________  EKG  Reviewed  interpreted at 1705 Heart rate 130 QRS 80 QTc 450 Sinus tachycardia, no evidence of acute ischemia or ectopy.  Normal QT interval ____________________________________________  RADIOLOGY  Patient had a previous head CT in April 16, 2021, negative for acute findings at that time.  He does report a previous seizure, this does not appear to be a new onset seizure disorder.  He has no evidence of trauma.  I do not believe that repeat CT imaging of the head would be indicated at this time unless he is to have further seizures in the ED ____________________________________________   PROCEDURES  Procedure(s) performed: None  Procedures  Critical Care performed: Yes, see critical care note(s)  CRITICAL CARE Performed by: April 18, 2021   Total critical care time: 35 minutes  Critical care time  was exclusive of separately billable procedures and treating other patients.  Critical care was necessary to treat or prevent imminent or life-threatening deterioration.  Critical care was time spent personally by me on the following activities: development of treatment plan with patient and/or surrogate as well as nursing, discussions with consultants, evaluation of patient's response to treatment, examination of patient, obtaining history from patient or surrogate, ordering and performing treatments and interventions, ordering and review of laboratory studies, ordering and review of radiographic studies, pulse oximetry and re-evaluation of patient's condition.  Patient presents after acute seizure suspect to be alcohol withdrawal.  He does demonstrate tachycardia, diaphoretic, tremulous, will treat with IV benzodiazepine with close monitoring for concerns high risk for recurrent seizure. ____________________________________________   INITIAL IMPRESSION / ASSESSMENT AND PLAN / ED COURSE  Pertinent labs & imaging results that were available during my care of the patient were reviewed by me and  considered in my medical decision making (see chart for details).   Seizure.  Atraumatic reassuring exam with regard to any trauma or injury denies pain or discomfort.  He does however look     ----------------------------------------- 5:40 PM on 08/23/2020 -----------------------------------------  Patient reassessed, withdrawal score 11.  He is alert, remains somewhat tremulous, hypertensive and tachycardic with heart rate about 120 now.  Overall symptoms are improving, will give additional 1 mg of IV Ativan now and resume CIWA at 715.  Discussed with nursing who is in agreement  Discussed with patient and he is agreeable understanding of plan for admission for further care and treatment of what appears to be acute alcohol withdrawal seizures.  Also, patient reported he had not had alcohol for 5 days but his blood alcohol level is 0.030 which is concerning for ongoing alcohol abuse and I suspect he has not been exactly forthcoming with the amount of ongoing alcohol which he consumes.  Labs indicative of some thrombocytopenia concerning I suspect secondary to alcohol suppression. No headache.  Admission discussed with hospitalist Dr. Sedalia Muta ____________________________________________   FINAL CLINICAL IMPRESSION(S) / ED DIAGNOSES  Final diagnoses:  Alcohol withdrawal seizure with complication, with unspecified complication (HCC)    Chemistry is now resulted, indicative of moderate probable metabolic acidosis.  I suspect in this setting with elevated anion gap this is likely secondary to patient's seizure immediately preceding his ED visit.  Currently receiving hydration, will plan to recheck repeat chemistry in about an hour.  Admission discussed with Dr. Sedalia Muta    Note:  This document was prepared using Dragon voice recognition software and may include unintentional dictation errors       Sharyn Creamer, MD 08/23/20 Julio Sicks    Sharyn Creamer, MD 08/23/20 [id number]

## 2020-08-23 NOTE — BH Assessment (Addendum)
Comprehensive Clinical Assessment (CCA) Note  08/23/2020 Christian BridgeKenneth [name] [id number]  Christian BridgeKenneth [name] is a 40 y.o male who presents to [facility] ED voluntarily for alcohol withdrawal seizure. Per triage note,  During TTS assessment pt presents drowsy and oriented x 4. Pt had tremulous psychomotor activity. The pt does not appear to be responding to internal or external stimuli . Pt's memory was impaired as he was able to verify the events that transpired prior to his arrival. Pt identified his main stressor t to be his recent housing issues/state of homelessness. Pt denies sx of depression, but admits to anxiety.  Pt reports an unsuccessful attempt to stop drinking, that resulted in a siezure. Pt was not receptive. Pt reported that he is employed and enrolled in school at [employer]. Pt reported that he prefers support groups or outpatient treatment due to his work/school obligations. Pt denies other substance use. [facility]  Pt reports a family hx of SA but is currently unable to provide specifics. Pt denies any current SI/HI/AH/VH.  Flowsheet Row ED from 12/07/2019 in [facility]  C-SSRS RISK CATEGORY No Risk    The patient demonstrates the following risk factors for suicide: Chronic risk factors for suicide include: substance use disorder. Acute risk factors for suicide include: N/A. Protective factors for this patient include: hope for the future. Considering these factors, the overall suicide risk at this point appears to be low. Patient is appropriate for outpatient follow up.  A 1:1 safety precautions sitter is not recommended.   Chief Complaint:  Chief Complaint  Patient presents with  . Seizures   Visit Diagnosis: Alcohol use disorder, severe    CCA Screening, Triage and Referral (STR)  Patient Reported Information How did you hear about us? Self  Referral name: Self  Referral phone  number: No data recorded  Whom do you see for routine medical problems? I don't have a doctor  Practice/Facility Name: No data recorded Practice/Facility Phone Number: No data recorded Name of Contact: No data recorded Contact Number: No data recorded Contact Fax Number: No data recorded Prescriber Name: No data recorded Prescriber Address (if known): No data recorded  What Is the Reason for Your Visit/Call Today? Withdrawal seizures  How Long Has This Been Causing You Problems? <Week  What Do You Feel Would Help You the Most Today? Alcohol or Drug Use Treatment   Have You Recently Been in Any Inpatient Treatment (Hospital/Detox/Crisis Center/28-Day Program)? No  Name/Location of Program/Hospital:No data recorded How Long Were You There? No data recorded When Were You Discharged? No data recorded  Have You Ever Received Services From [facility] Before? No  Who Do You See at [facility]? No data recorded  Have You Recently Had Any Thoughts About Hurting Yourself? No  Are You Planning to Commit Suicide/Harm Yourself At This time? No   Have you Recently Had Thoughts About Hurting Someone Karolee Ohslse? No  Explanation: No data recorded  Have You Used Any Alcohol or Drugs in the Past 24 Hours? No  How Long Ago Did You Use Drugs or Alcohol? No data recorded What Did You Use and How Much? No data recorded  Do You Currently Have a Therapist/Psychiatrist? No  Name of Therapist/Psychiatrist: No data recorded  Have You Been Recently Discharged From Any Office Practice or Programs? No  Explanation of Discharge From Practice/Program: No data recorded    CCA Screening Triage Referral Assessment Type of Contact:  Face-to-Face  Is this Initial or Reassessment? No data recorded Date Telepsych consult ordered in [facility]:  No data recorded Time Telepsych consult ordered in [facility]:  No data recorded  Patient Reported Information Reviewed? Yes  Patient Left Without Being Seen? No data  recorded Reason for Not Completing Assessment: No data recorded  Collateral Involvement: None   Does Patient Have a Court Appointed Legal Guardian? No data recorded Name and Contact of Legal Guardian: No data recorded If Minor and Not Living with Parent(s), Who has Custody? n/a  Is CPS involved or ever been involved? Never  Is APS involved or ever been involved? Never   Patient Determined To Be At Risk for Harm To Self or Others Based on Review of Patient Reported Information or Presenting Complaint? No  Method: No data recorded Availability of Means: No data recorded Intent: No data recorded Notification Required: No data recorded Additional Information for Danger to Others Potential: No data recorded Additional Comments for Danger to Others Potential: No data recorded Are There Guns or Other Weapons in Your Home? No data recorded Types of Guns/Weapons: No data recorded Are These Weapons Safely Secured?                            No data recorded Who Could Verify You Are Able To Have These Secured: No data recorded Do You Have any Outstanding Charges, Pending Court Dates, Parole/Probation? No data recorded Contacted To Inform of Risk of Harm To Self or Others: No data recorded  Location of Assessment: [facility] ED   Does Patient Present under Involuntary Commitment? No  IVC Papers Initial File Date: No data recorded  [place] of Residence: [place]   Patient Currently Receiving the Following Services: Not Receiving Services   Determination of Need: Emergent (2 hours)   Options For Referral: Outpatient Therapy     CCA Biopsychosocial Intake/Chief Complaint:  Seizure  Current Symptoms/Problems: Pt reported symptoms of anxiety and problems with alcohol   Patient Reported Schizophrenia/Schizoaffective Diagnosis in Past: No   Strengths: Pt is able to maintain employment and is in school  Preferences: Pt prefers outpatient treatment due to school and work  obligations  Abilities: Pt is able to take care of self   Type of Services Patient Feels are Needed: Support groups; oupatient treatment   Initial Clinical Notes/Concerns: No data recorded  Mental Health Symptoms Depression:  None   Duration of Depressive symptoms: No data recorded  Mania:  None   Anxiety:   Tension; Worrying   Psychosis:  None   Duration of Psychotic symptoms: No data recorded  Trauma:  None   Obsessions:  None   Compulsions:  None   Inattention:  None   Hyperactivity/Impulsivity:  N/A   Oppositional/Defiant Behaviors:  None   Emotional Irregularity:  None   Other Mood/Personality Symptoms:  No data recorded   Mental Status Exam Appearance and self-care  Stature:  Average   Weight:  Average weight   Clothing:  Disheveled   Grooming:  Neglected   Cosmetic use:  None   Posture/gait:  Normal   Motor activity:  Tremor   Sensorium  Attention:  Normal   Concentration:  Normal   Orientation:  Place; Situation; Time; Person; Object   Recall/memory:  Normal   Affect and Mood  Affect:  Flat   Mood:  Dysphoric   Relating  Eye contact:  Fleeting   Facial expression:  Constricted   Attitude toward examiner:  Cooperative   Thought and Language  Speech flow: Slurred; Soft   Thought content:  Appropriate to Mood and Circumstances   Preoccupation:  None   Hallucinations:  None   Organization:  No data recorded  [employer] of Knowledge:  Average   Intelligence:  Average   Abstraction:  Normal   Judgement:  Common-sensical   Reality Testing:  Adequate   Insight:  Lacking   Decision Making:  Normal   Social Functioning  Social Maturity:  Responsible   Social Judgement:  Normal   Stress  Stressors:  Housing   Coping Ability:  Overwhelmed   Skill Deficits:  Self-care   Supports:  Friends/Service system; Family     Religion:    Leisure/Recreation:    Exercise/Diet: Exercise/Diet Have  You Gained or Lost A Significant Amount of Weight in the Past Six Months?: No Do You Follow a Special Diet?: No Do You Have Any Trouble Sleeping?: Yes Explanation of Sleeping Difficulties: Pt reported issues with falling asleep   CCA Employment/Education Employment/Work Situation: Employment / Work Situation Employment situation: Employed Where is patient currently employed?: [employer] How long has patient been employed?: 3 Months Patient's job has been impacted by current illness: No Has patient ever been in the [employer]?: Yes (Describe in comment)  Education: Education Is Patient Currently Attending School?: Yes School Currently Attending: [employer] Last Grade Completed: 12 Did Garment/textile technologist From [employer]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   CCA Family/Childhood History Family and Relationship History: Family history Marital status: Single Does patient have children?: Yes How many children?: 2 How is patient's relationship with their children?: Good  Childhood History:  Childhood History By whom was/is the patient raised?: Both parents Additional childhood history information: Pt reported having a "good enough" childhood Description of patient's relationship with caregiver when they were a child: Good Patient's description of current relationship with people who raised him/her: Good Does patient have siblings?: Yes Number of Siblings: 4 Description of patient's current relationship with siblings: Good Did patient suffer any verbal/emotional/physical/sexual abuse as a child?: No Did patient suffer from severe childhood neglect?: No Has patient ever been sexually abused/assaulted/raped as an adolescent or adult?: No Was the patient ever a victim of a crime or a disaster?: No Witnessed domestic violence?: No Has patient been affected by domestic violence as an adult?: No  Child/Adolescent Assessment:     CCA Substance Use Alcohol/Drug Use: Alcohol / Drug Use Pain Medications: See MAR Prescriptions:  See MAR Over the Counter: See MAR History of alcohol / drug use?: Yes Substance #1 Name of Substance 1: Marijuana 1 - Age of First Use: 21 1 - Amount (size/oz): 1/5th of liquor 1 - Frequency: on weekends/days off 1 - Last Use / Amount: 08/21/20/ 1/5th of liquor                       ASAM's:  Six Dimensions of Multidimensional Assessment  Dimension 1:  Acute Intoxication and/or Withdrawal Potential:   Dimension 1:  Description of individual's past and current experiences of substance use and withdrawal: Pt has a hx of alcohol withdrawal siezures  Dimension 2:  Biomedical Conditions and Complications:   Dimension 2:  Description of patient's biomedical conditions and  complications: Pt has a hx of siezures  Dimension 3:  Emotional, Behavioral, or Cognitive Conditions and Complications:  Dimension 3:  Description of emotional, behavioral, or cognitive conditions and complications: Pt reports symptoms of anxiety  Dimension 4:  Readiness to Change:  Dimension 4:  Description of Readiness to Change criteria: Pt vacillates with motivation for change  Dimension 5:  Relapse, Continued use, or Continued Problem Potential:  Dimension 5:  Relapse, continued use, or continued problem potential critiera description: Pt has attempted to quit cold [place] but was unsuccessful  Dimension 6:  Recovery/Living Environment:     ASAM Severity Score: ASAM's Severity Rating Score: 12  ASAM Recommended Level of Treatment: ASAM Recommended Level of Treatment: Level II Partial Hospitalization Treatment   Substance use Disorder (SUD) Substance Use Disorder (SUD)  Checklist Symptoms of Substance Use: Continued use despite having a persistent/recurrent physical/psychological problem caused/exacerbated by use,Evidence of withdrawal (Comment),Persistent desire or unsuccessful efforts to cut down or control use  Recommendations for Services/Supports/Treatments: Recommendations for  Services/Supports/Treatments Recommendations For Services/Supports/Treatments: Detox,SAIOP (Substance Abuse Intensive Outpatient Program)  DSM5 Diagnoses: Patient Active Problem List   Diagnosis Date Noted  . Seizure (HCC) 04/17/2019  . Thrombocytopenia (HCC) 04/17/2019  . ETOH abuse 04/17/2019  . Hypokalemia 04/17/2019  . Seizures (HCC) 04/17/2019    Goebel Hellums R Dalzell, LCAS

## 2020-08-23 NOTE — ED Notes (Signed)
Pt provided with urinal to collect UA specimen at this time.

## 2020-08-23 NOTE — ED Triage Notes (Signed)
Pt BIB EMS for a seizure. Pt was helping friend jumpstart car. Friend stated down time was approximately 2 minutes.   Pt has hx of seizures and hasn't taken medicine for the last 3 months.

## 2020-08-23 NOTE — H&P (Signed)
History and Physical   Christian [name] QIO:[id number] DOB: 1981-02-22 DOA: 08/23/2020  PCP: Patient, No Pcp Per  Patient coming from: home  I have personally briefly reviewed patient's old medical records in [facility] EMR.  Chief Concern: seizures  HPI: Christian [name] is a 40 y.o. male with medical history significant for alcohol dependence, history of seizures, current homelessness, presents emergency department for chief concerns of seizures.  Per his friend patient hit his head.  At bedside patient is confused and lethargic.  He was able to tell me his full name and slurred his speech.  He was able to tell me his age and that he is in the hospital.  He states that he did not drink any alcohol today.  He states that he has been trying to quit.  He was helping his friend with his his car and he had a witnessed seizure.  Per friend, patient was down for about 2 minutes.  Patient does not know what happened.  Social history: Patient lives with different people.  He smokes cigarettes every day.  And he drinks 1/5 a day.  He was unclear about his amount of drinking.  Vaccination: Patient states he is vaccinated for COVID-19, 2 doses  ROS: Constitutional: no weight change, no fever ENT/Mouth: no sore throat, no rhinorrhea Eyes: no eye pain, no vision changes Cardiovascular: no chest pain, no dyspnea,  no edema, no palpitations Respiratory: no cough, no sputum, no wheezing Gastrointestinal: no nausea, no vomiting, no diarrhea, no constipation Genitourinary: no urinary incontinence, no dysuria, no hematuria Musculoskeletal: no arthralgias, no myalgias Skin: no skin lesions, no pruritus, Neuro: + weakness, no loss of consciousness, no syncope Psych: no anxiety, no depression, + decrease appetite Heme/Lymph: no bruising, no bleeding  ED Course: Discussed with ED provider, patient requiring hospitalization due to seizures.  Vitals in the emergency department showed a temperature of 98.3,  respiration rate of 22, blood pressure 149/108, patient satting at 100% on room air.  Patient was started on CIWA protocol.  Assessment/Plan  Active Problems:   Seizure (HCC)   Thrombocytopenia (HCC)   ETOH abuse   Seizures secondary to alcohol withdrawal -Telemetry monitoring -Admit to progressive cardiac observation -CIWA protocol -TOC for counseling -Seizure precaution, fall precautions -Frequent vitals  Metabolic acidosis with anion gap-suspect secondary to alcohol ingestion -Patient status post banana bag, normal saline 1 L, thiamine, folate, multivitamin -Normal saline 1 L bolus -Normal saline 75 mL/h continuous -Continue thiamine supplementation  Sinus tachycardia secondary to withdrawals, treat as above  Elevated AST/ALT- secondary to to chronic alcohol abuse  Neuropathy-resumed gabapentin 400 mg 3 times daily  Head trauma-CT of the head without contrast ordered  Chronic thrombocytopenia-suspect secondary to alcohol abuse,  Chart reviewed.   DVT prophylaxis: TED hose, no pharmacologic DVT prophylaxis due to thrombocytopenia less than 100 platelets Code Status: Full code Diet: Regular diet Family Communication: No Disposition Plan: Pending clinical course Consults called: No Admission status: Ops, progressive cardiac, with telemetry (3 days ordered)  No past medical history on file.  Past Surgical History:  Procedure Laterality Date  . BACK SURGERY     Social History:  reports that he has been smoking. He has never used smokeless tobacco. He reports current alcohol use. He reports current drug use. Drug: Marijuana.  No Known Allergies Family History  Problem Relation Age of Onset  . Seizures Neg Hx    Family history: Family history reviewed and not pertinent  Prior to Admission medications   Medication Sig  Start Date End Date Taking? Authorizing Provider  chlordiazePOXIDE (LIBRIUM) 25 MG capsule Continue taper: Take Librium 25 mg (1 tab) three  times daily today then twice daily tomorrow then once daily then stop 04/18/19   Danford, Earl Lites, MD  folic acid (FOLVITE) 1 MG tablet Take 1 tablet (1 mg total) by mouth daily. 04/18/19   Danford, Earl Lites, MD  gabapentin (NEURONTIN) 400 MG capsule Take 1 capsule (400 mg total) by mouth 3 (three) times daily. 04/18/19 06/17/19  Danford, Earl Lites, MD  thiamine 100 MG tablet Take 1 tablet (100 mg total) by mouth daily. 04/18/19   Alberteen Sam, MD   Physical Exam: Vitals:   08/23/20 2230 08/23/20 2245 08/23/20 2300 08/23/20 2315  BP: (!) 139/97  126/87   Pulse: (!) 111 (!) 103 (!) 103 (!) 103  Resp: 20 19 18  (!) 21  Temp:      TempSrc:      SpO2: 97% 97% 95% 93%  Weight:      Height:       Constitutional: appears age-appropriate, lethargic, NAD, calm, comfortable Eyes: PERRL, lids and conjunctivae normal ENMT: Mucous membranes are moist. Posterior pharynx clear of any exudate or lesions. Age-appropriate dentition. Hearing appropriate Neck: normal, supple, no masses, no thyromegaly Respiratory: clear to auscultation bilaterally, no wheezing, no crackles. Normal respiratory effort. No accessory muscle use.  Cardiovascular: Regular rate and rhythm, no murmurs / rubs / gallops. No extremity edema. 2+ pedal pulses. No carotid bruits.  Abdomen: no tenderness, no masses palpated, no hepatosplenomegaly. Bowel sounds positive.  Musculoskeletal: no clubbing / cyanosis. No joint deformity upper and lower extremities. Good ROM, no contractures, no atrophy. Normal muscle tone.  Skin: no rashes, lesions, ulcers. No induration Neurologic: Sensation intact. Strength 5/5 in all 4.  Psychiatric: Normal judgment and insight. Alert and oriented x 3. Normal mood.   EKG: independently reviewed, showing sinus tachycardia with rate 128, QTc 450  Chest x-ray on Admission: Not indicated  Labs on Admission: I have personally reviewed following labs  CBC: Recent Labs  Lab  08/23/20 1705  WBC 4.2  NEUTROABS 2.5  HGB 13.5  HCT 37.0*  MCV 89.6  PLT 64*   Basic Metabolic Panel: Recent Labs  Lab 08/23/20 1705 08/23/20 2040  NA 134* 135  K 3.7 3.0*  CL 96* 100  CO2 16* 27  GLUCOSE 166* 102*  BUN 5* <5*  CREATININE 0.86 0.64  CALCIUM 8.7* 8.5*  MG 1.4*  --    GFR: Estimated Creatinine Clearance: 124 mL/min (by C-G formula based on SCr of 0.64 mg/dL).  Liver Function Tests: Recent Labs  Lab 08/23/20 1705  AST 161*  ALT 93*  ALKPHOS 57  BILITOT 1.1  PROT 7.4  ALBUMIN 4.1   Recent Labs  Lab 08/23/20 1705  LIPASE 42   Mabry Tift N Celestine Prim D.O. [facility]  If 7PM-7AM, please contact overnight-coverage provider If 7AM-7PM, please contact day coverage provider [email or web address]  08/23/2020, 11:49 PM

## 2020-08-24 ENCOUNTER — Encounter: Payer: Self-pay | Admitting: Internal Medicine

## 2020-08-24 ENCOUNTER — Other Ambulatory Visit: Payer: Self-pay

## 2020-08-24 ENCOUNTER — Observation Stay: Payer: BC Managed Care – PPO

## 2020-08-24 DIAGNOSIS — R569 Unspecified convulsions: Secondary | ICD-10-CM | POA: Diagnosis not present

## 2020-08-24 DIAGNOSIS — F1010 Alcohol abuse, uncomplicated: Secondary | ICD-10-CM | POA: Diagnosis not present

## 2020-08-24 DIAGNOSIS — D696 Thrombocytopenia, unspecified: Secondary | ICD-10-CM | POA: Diagnosis not present

## 2020-08-24 LAB — CBC
HCT: 34.4 % — ABNORMAL LOW (ref 39.0–52.0)
Hemoglobin: 12.8 g/dL — ABNORMAL LOW (ref 13.0–17.0)
MCH: 33.3 pg (ref 26.0–34.0)
MCHC: 37.2 g/dL — ABNORMAL HIGH (ref 30.0–36.0)
MCV: 89.6 fL (ref 80.0–100.0)
Platelets: 58 10*3/uL — ABNORMAL LOW (ref 150–400)
RBC: 3.84 MIL/uL — ABNORMAL LOW (ref 4.22–5.81)
RDW: 18 % — ABNORMAL HIGH (ref 11.5–15.5)
WBC: 4.1 10*3/uL (ref 4.0–10.5)
nRBC: 0 % (ref 0.0–0.2)

## 2020-08-24 LAB — PHOSPHORUS: Phosphorus: 3.1 mg/dL (ref 2.5–4.6)

## 2020-08-24 LAB — COMPREHENSIVE METABOLIC PANEL
ALT: 77 U/L — ABNORMAL HIGH (ref 0–44)
AST: 115 U/L — ABNORMAL HIGH (ref 15–41)
Albumin: 3.7 g/dL (ref 3.5–5.0)
Alkaline Phosphatase: 49 U/L (ref 38–126)
Anion gap: 8 (ref 5–15)
BUN: <5 mg/dL — ABNORMAL LOW (ref 6–20)
CO2: 28 mmol/L (ref 22–32)
Calcium: 8.7 mg/dL — ABNORMAL LOW (ref 8.9–10.3)
Chloride: 101 mmol/L (ref 98–111)
Creatinine, Ser: 0.73 mg/dL (ref 0.61–1.24)
GFR, Estimated: >60 mL/min (ref >60)
Glucose, Bld: 95 mg/dL (ref 70–99)
Potassium: 3.2 mmol/L — ABNORMAL LOW (ref 3.5–5.1)
Sodium: 137 mmol/L (ref 135–145)
Total Bilirubin: 1.3 mg/dL — ABNORMAL HIGH (ref 0.3–1.2)
Total Protein: 6.8 g/dL (ref 6.5–8.1)

## 2020-08-24 LAB — MAGNESIUM: Magnesium: 1.8 mg/dL (ref 1.7–2.4)

## 2020-08-24 LAB — HIV ANTIBODY (ROUTINE TESTING W REFLEX): HIV Screen 4th Generation wRfx: NONREACTIVE

## 2020-08-24 LAB — CK: Total CK: 167 U/L (ref 49–397)

## 2020-08-24 MED ORDER — NICOTINE 21 MG/24HR TD PT24
21.0000 mg | MEDICATED_PATCH | Freq: Every day | TRANSDERMAL | Status: DC
  Administered 2020-08-24: 21 mg via TRANSDERMAL
  Filled 2020-08-24: qty 1

## 2020-08-24 MED ORDER — POTASSIUM CHLORIDE 10 MEQ/100ML IV SOLN
10.0000 meq | INTRAVENOUS | Status: AC
  Administered 2020-08-24 (×2): 10 meq via INTRAVENOUS
  Filled 2020-08-24: qty 100

## 2020-08-24 MED ORDER — POTASSIUM CHLORIDE ER 10 MEQ PO TBCR: 20.0000 meq | EXTENDED_RELEASE_TABLET | Freq: Every day | ORAL | 0 refills | Status: DC

## 2020-08-24 NOTE — ED Notes (Signed)
Awaiting potassium from pharmacy, ED pyxis out.

## 2020-08-24 NOTE — ED Notes (Signed)
Patient transported to CT 

## 2020-08-24 NOTE — ED Notes (Signed)
Pt resting on stretcher, no acute distress noted. Stretcher in lowest and locked position, bed alarm in placed and audible, seizure pads on rails of stretcher, suction and O2 at bedside.

## 2020-08-24 NOTE — ED Notes (Signed)
Up to bathroom

## 2020-08-24 NOTE — ED Notes (Signed)
Pt eating breakfast 

## 2020-08-24 NOTE — ED Notes (Signed)
Received shift report from Ally RN. 

## 2020-08-24 NOTE — Discharge Summary (Signed)
Physician Discharge Summary  Patient ID: Christian [name] MRN: [id number] DOB/AGE: Jul 16, 1980 40 y.o.  Admit date: 08/23/2020 Discharge date: 08/24/2020  Admission Diagnoses:  Discharge Diagnoses:  Active Problems:   Seizure (HCC)   Thrombocytopenia (HCC)   ETOH abuse Chronic alcoholic hepatitis. Hypokalemia Hypomagnesemia  Discharged Condition: good  Hospital Course: Christian [name] is a 40 y.o. male with medical history significant for alcohol dependence, history of seizures, current homelessness, presents emergency department for chief concerns of seizures.  Last drink of alcohol was 2 days ago, this is alcohol withdrawal seizure. Patient did not have any additional seizure after in the hospital, he was given fluids.  He was also received thiamine and folic acid.  He did not show any signs of alcohol withdrawal.  At this point, he is medically stable to be discharged. Patient received magnesium supplement yesterday, magnesium level today is normal.  He also is given potassium.  I will continue oral supplement potassium for the next 3 days.  Advised to quit alcohol drinking.    Consults: None  Significant Diagnostic Studies:   Treatments: IVF  Discharge Exam: Blood pressure (!) 158/109, pulse 86, temperature 98.8 F (37.1 C), temperature source Oral, resp. rate 17, height 5\' 9"  (1.753 m), weight 77.1 kg, SpO2 98 %. General appearance: alert, cooperative and Oriented to time place and person. Resp: clear to auscultation bilaterally Cardio: regular rate and rhythm, S1, S2 normal, no murmur, click, rub or gallop GI: soft, non-tender; bowel sounds normal; no masses,  no organomegaly Extremities: extremities normal, atraumatic, no cyanosis or edema  Disposition: Discharge disposition: 01-Home or Self Care       Discharge Instructions    Diet - low sodium heart healthy   Complete by: As directed    Increase activity slowly   Complete by: As directed      Allergies as of  08/24/2020   No Known Allergies     Medication List    TAKE these medications   chlordiazePOXIDE 25 MG capsule Commonly known as: LIBRIUM Continue taper: Take Librium 25 mg (1 tab) three times daily today then twice daily tomorrow then once daily then stop   folic acid 1 MG tablet Commonly known as: FOLVITE Take 1 tablet (1 mg total) by mouth daily.   gabapentin 400 MG capsule Commonly known as: Neurontin Take 1 capsule (400 mg total) by mouth 3 (three) times daily.   potassium chloride 10 MEQ tablet Commonly known as: KLOR-CON Take 2 tablets (20 mEq total) by mouth daily for 3 days.   thiamine 100 MG tablet Take 1 tablet (100 mg total) by mouth daily.        Signed: 08/26/2020 08/24/2020, 3:20 PM

## 2020-08-24 NOTE — Progress Notes (Signed)
Patient reviewed discharge instructions and had no questions at time of discharge. Vitals WDL at time of discharge. Taken downstairs by [employer].

## 2020-10-17 ENCOUNTER — Emergency Department: Payer: BC Managed Care – PPO

## 2020-10-17 ENCOUNTER — Encounter: Payer: Self-pay | Admitting: Emergency Medicine

## 2020-10-17 ENCOUNTER — Inpatient Hospital Stay
Admission: EM | Admit: 2020-10-17 | Discharge: 2020-10-19 | DRG: 894 | Discharge status: Against Medical Advice | Payer: BC Managed Care – PPO | Attending: Internal Medicine | Admitting: Internal Medicine

## 2020-10-17 ENCOUNTER — Other Ambulatory Visit: Payer: Self-pay

## 2020-10-17 DIAGNOSIS — R0781 Pleurodynia: Secondary | ICD-10-CM | POA: Diagnosis present

## 2020-10-17 DIAGNOSIS — F10239 Alcohol dependence with withdrawal, unspecified: Secondary | ICD-10-CM | POA: Diagnosis not present

## 2020-10-17 DIAGNOSIS — F17200 Nicotine dependence, unspecified, uncomplicated: Secondary | ICD-10-CM | POA: Diagnosis present

## 2020-10-17 DIAGNOSIS — Y900 Blood alcohol level of less than 20 mg/100 ml: Secondary | ICD-10-CM | POA: Diagnosis present

## 2020-10-17 DIAGNOSIS — E876 Hypokalemia: Secondary | ICD-10-CM | POA: Diagnosis present

## 2020-10-17 DIAGNOSIS — F10939 Alcohol use, unspecified with withdrawal, unspecified: Secondary | ICD-10-CM | POA: Diagnosis present

## 2020-10-17 DIAGNOSIS — R443 Hallucinations, unspecified: Secondary | ICD-10-CM

## 2020-10-17 DIAGNOSIS — K7010 Alcoholic hepatitis without ascites: Secondary | ICD-10-CM | POA: Diagnosis present

## 2020-10-17 DIAGNOSIS — F10951 Alcohol use, unspecified with alcohol-induced psychotic disorder with hallucinations: Secondary | ICD-10-CM

## 2020-10-17 DIAGNOSIS — R7989 Other specified abnormal findings of blood chemistry: Secondary | ICD-10-CM

## 2020-10-17 DIAGNOSIS — F10139 Alcohol abuse with withdrawal, unspecified: Principal | ICD-10-CM | POA: Diagnosis present

## 2020-10-17 DIAGNOSIS — R569 Unspecified convulsions: Secondary | ICD-10-CM | POA: Diagnosis not present

## 2020-10-17 DIAGNOSIS — E871 Hypo-osmolality and hyponatremia: Secondary | ICD-10-CM | POA: Diagnosis present

## 2020-10-17 DIAGNOSIS — F10231 Alcohol dependence with withdrawal delirium: Secondary | ICD-10-CM | POA: Diagnosis not present

## 2020-10-17 DIAGNOSIS — F10129 Alcohol abuse with intoxication, unspecified: Secondary | ICD-10-CM | POA: Diagnosis present

## 2020-10-17 DIAGNOSIS — Z20822 Contact with and (suspected) exposure to covid-19: Secondary | ICD-10-CM | POA: Diagnosis present

## 2020-10-17 DIAGNOSIS — R030 Elevated blood-pressure reading, without diagnosis of hypertension: Secondary | ICD-10-CM | POA: Diagnosis not present

## 2020-10-17 DIAGNOSIS — D696 Thrombocytopenia, unspecified: Secondary | ICD-10-CM | POA: Diagnosis present

## 2020-10-17 DIAGNOSIS — Z79899 Other long term (current) drug therapy: Secondary | ICD-10-CM

## 2020-10-17 LAB — COMPREHENSIVE METABOLIC PANEL
ALT: 270 U/L — ABNORMAL HIGH (ref 0–44)
AST: 333 U/L — ABNORMAL HIGH (ref 15–41)
Albumin: 4 g/dL (ref 3.5–5.0)
Alkaline Phosphatase: 80 U/L (ref 38–126)
Anion gap: 12 (ref 5–15)
BUN: 10 mg/dL (ref 6–20)
CO2: 23 mmol/L (ref 22–32)
Calcium: 9.1 mg/dL (ref 8.9–10.3)
Chloride: 97 mmol/L — ABNORMAL LOW (ref 98–111)
Creatinine, Ser: 0.72 mg/dL (ref 0.61–1.24)
GFR, Estimated: >60 mL/min (ref >60)
Glucose, Bld: 117 mg/dL — ABNORMAL HIGH (ref 70–99)
Potassium: 3.5 mmol/L (ref 3.5–5.1)
Sodium: 132 mmol/L — ABNORMAL LOW (ref 135–145)
Total Bilirubin: 2.3 mg/dL — ABNORMAL HIGH (ref 0.3–1.2)
Total Protein: 7.8 g/dL (ref 6.5–8.1)

## 2020-10-17 LAB — CBC WITH DIFFERENTIAL/PLATELET
Abs Immature Granulocytes: 0.02 10*3/uL (ref 0.00–0.07)
Basophils Absolute: 0 10*3/uL (ref 0.0–0.1)
Basophils Relative: 0 %
Eosinophils Absolute: 0 10*3/uL (ref 0.0–0.5)
Eosinophils Relative: 1 %
HCT: 38.7 % — ABNORMAL LOW (ref 39.0–52.0)
Hemoglobin: 14 g/dL (ref 13.0–17.0)
Immature Granulocytes: 0 %
Lymphocytes Relative: 22 %
Lymphs Abs: 1.4 10*3/uL (ref 0.7–4.0)
MCH: 30.4 pg (ref 26.0–34.0)
MCHC: 36.2 g/dL — ABNORMAL HIGH (ref 30.0–36.0)
MCV: 83.9 fL (ref 80.0–100.0)
Monocytes Absolute: 0.6 10*3/uL (ref 0.1–1.0)
Monocytes Relative: 10 %
Neutro Abs: 4.3 10*3/uL (ref 1.7–7.7)
Neutrophils Relative %: 67 %
Platelets: 65 10*3/uL — ABNORMAL LOW (ref 150–400)
RBC: 4.61 MIL/uL (ref 4.22–5.81)
RDW: 18 % — ABNORMAL HIGH (ref 11.5–15.5)
WBC: 6.4 10*3/uL (ref 4.0–10.5)
nRBC: 0 % (ref 0.0–0.2)

## 2020-10-17 LAB — MAGNESIUM: Magnesium: 1.2 mg/dL — ABNORMAL LOW (ref 1.7–2.4)

## 2020-10-17 MED ORDER — THIAMINE HCL 100 MG/ML IJ SOLN
Freq: Once | INTRAVENOUS | Status: AC
  Filled 2020-10-17: qty 1000

## 2020-10-17 MED ORDER — THIAMINE HCL 100 MG PO TABS: 100.0000 mg | ORAL_TABLET | Freq: Every day | ORAL | Status: DC

## 2020-10-17 MED ORDER — LORAZEPAM 2 MG/ML IJ SOLN
0.0000 mg | Freq: Four times a day (QID) | INTRAMUSCULAR | Status: DC
  Administered 2020-10-17: 1 mg via INTRAVENOUS
  Administered 2020-10-18: 2 mg via INTRAVENOUS
  Administered 2020-10-18: 1 mg via INTRAVENOUS
  Administered 2020-10-18: 2 mg via INTRAVENOUS
  Filled 2020-10-17 (×4): qty 1

## 2020-10-17 MED ORDER — POTASSIUM CHLORIDE 20 MEQ PO PACK: 40.0000 meq | PACK | Freq: Once | ORAL | Status: DC

## 2020-10-17 MED ORDER — THIAMINE HCL 100 MG/ML IJ SOLN: Freq: Once | INTRAVENOUS | Status: DC

## 2020-10-17 MED ORDER — MAGNESIUM SULFATE 2 GM/50ML IV SOLN
2.0000 g | Freq: Once | INTRAVENOUS | Status: AC
  Administered 2020-10-17: 2 g via INTRAVENOUS
  Filled 2020-10-17: qty 50

## 2020-10-17 MED ORDER — LORAZEPAM 2 MG PO TABS
0.0000 mg | ORAL_TABLET | Freq: Four times a day (QID) | ORAL | Status: DC
  Administered 2020-10-18: 1 mg via ORAL
  Administered 2020-10-19: 2 mg via ORAL
  Filled 2020-10-17 (×3): qty 1

## 2020-10-17 MED ORDER — SODIUM CHLORIDE 0.9 % IV SOLN: INTRAVENOUS | Status: DC

## 2020-10-17 MED ORDER — ACETAMINOPHEN 650 MG RE SUPP: 650.0000 mg | Freq: Four times a day (QID) | RECTAL | Status: DC | PRN

## 2020-10-17 MED ORDER — LORAZEPAM 2 MG/ML IJ SOLN: 0.0000 mg | Freq: Two times a day (BID) | INTRAMUSCULAR | Status: DC

## 2020-10-17 MED ORDER — TRAZODONE HCL 50 MG PO TABS
25.0000 mg | ORAL_TABLET | Freq: Every evening | ORAL | Status: DC | PRN
  Filled 2020-10-17: qty 1

## 2020-10-17 MED ORDER — THIAMINE HCL 100 MG/ML IJ SOLN: 100.0000 mg | Freq: Every day | INTRAMUSCULAR | Status: DC

## 2020-10-17 MED ORDER — LORAZEPAM 2 MG/ML IJ SOLN
1.0000 mg | Freq: Once | INTRAMUSCULAR | Status: AC
  Administered 2020-10-17: 1 mg via INTRAVENOUS
  Filled 2020-10-17: qty 1

## 2020-10-17 MED ORDER — ACETAMINOPHEN 325 MG PO TABS: 650.0000 mg | ORAL_TABLET | Freq: Four times a day (QID) | ORAL | Status: DC | PRN

## 2020-10-17 MED ORDER — ONDANSETRON HCL 4 MG/2ML IJ SOLN: 4.0000 mg | Freq: Four times a day (QID) | INTRAMUSCULAR | Status: DC | PRN

## 2020-10-17 MED ORDER — ENOXAPARIN SODIUM 40 MG/0.4ML IJ SOSY: 40.0000 mg | PREFILLED_SYRINGE | INTRAMUSCULAR | Status: DC

## 2020-10-17 MED ORDER — LORAZEPAM 2 MG PO TABS: 0.0000 mg | ORAL_TABLET | Freq: Two times a day (BID) | ORAL | Status: DC

## 2020-10-17 MED ORDER — ONDANSETRON HCL 4 MG PO TABS: 4.0000 mg | ORAL_TABLET | Freq: Four times a day (QID) | ORAL | Status: DC | PRN

## 2020-10-17 MED ORDER — MAGNESIUM HYDROXIDE 400 MG/5ML PO SUSP: 30.0000 mL | Freq: Every day | ORAL | Status: DC | PRN

## 2020-10-17 NOTE — ED Notes (Signed)
Pt given sandwich tray at this time.  Pt noted to open tray, pick up pack of crackers and looked to the right where there was nobody and asked "do you want any?"

## 2020-10-17 NOTE — ED Triage Notes (Signed)
Pts family states that pt has seizures he had one Friday and hit his head on the dresser, she reports since then he cant remember some things and is hearing conversations that are not there.

## 2020-10-17 NOTE — ED Provider Notes (Signed)
[facility] Emergency Department Provider Note    ____________________________________________   I have reviewed the triage vital signs and the nursing notes.   HISTORY  Chief Complaint Memory Loss and Hallucinations   History limited by: Not Limited   HPI Christian [name] is a 40 y.o. male who presents to the emergency department today because of concerns for hallucinations and memory issues.  Patient apparently had a seizure 2 days ago.  During the seizure he did hit his head against furniture.  Patient apparently has seizures when he is withdrawing from alcohol.  Last drink was 3 days ago.  Since his seizure family has noticed the patient has been having issues with memory and concentration.  Additionally they have noticed the patient responding to internal stimuli.  The patient does state he thinks that he is having conversations with people that are there but then family tells him that there was no loss there.  He has also seen people.  Has never had similar symptoms in the past.  Patient is also complaining of some bilateral rib pain.   Records reviewed. Per medical record review patient has a history of seizures, alcohol abuse.   History reviewed. No pertinent past medical history.  Patient Active Problem List   Diagnosis Date Noted  . Seizure (HCC) 04/17/2019  . Thrombocytopenia (HCC) 04/17/2019  . ETOH abuse 04/17/2019  . Hypokalemia 04/17/2019  . Seizures (HCC) 04/17/2019    Past Surgical History:  Procedure Laterality Date  . BACK SURGERY      Prior to Admission medications   Medication Sig Start Date End Date Taking? Authorizing Provider  chlordiazePOXIDE (LIBRIUM) 25 MG capsule Continue taper: Take Librium 25 mg (1 tab) three times daily today then twice daily tomorrow then once daily then stop 04/18/19   Danford, Earl Lites, MD  folic acid (FOLVITE) 1 MG tablet Take 1 tablet (1 mg total) by mouth daily. 04/18/19   Danford, Earl Lites,  MD  gabapentin (NEURONTIN) 400 MG capsule Take 1 capsule (400 mg total) by mouth 3 (three) times daily. 04/18/19 06/17/19  Danford, Earl Lites, MD  potassium chloride (KLOR-CON) 10 MEQ tablet Take 2 tablets (20 mEq total) by mouth daily for 3 days. 08/24/20 08/27/20  Marrion Coy, MD  thiamine 100 MG tablet Take 1 tablet (100 mg total) by mouth daily. 04/18/19   Danford, Earl Lites, MD    Allergies Patient has no known allergies.  Family History  Problem Relation Age of Onset  . Seizures Neg Hx     Social History Social History   Tobacco Use  . Smoking status: Current Every Day Smoker  . Smokeless tobacco: Never Used  Substance Use Topics  . Alcohol use: Yes  . Drug use: Yes    Types: Marijuana    Review of Systems Constitutional: No fever/chills Eyes: No visual changes. ENT: No sore throat. Cardiovascular: Positive for bilateral chest pain. Respiratory: Denies shortness of breath. Gastrointestinal: No abdominal pain.  No nausea, no vomiting.  No diarrhea.   Genitourinary: Negative for dysuria. Musculoskeletal: Negative for back pain. Skin: Negative for rash. Neurological: Positive for seizure, memory loss  ____________________________________________   PHYSICAL EXAM:  VITAL SIGNS: ED Triage Vitals  Enc Vitals Group     BP 10/17/20 2152 (!) 129/100     Pulse Rate 10/17/20 2152 (!) 112     Resp 10/17/20 2152 20     Temp 10/17/20 2152 98.5 F (36.9 C)     Temp src --  SpO2 10/17/20 2152 98 %     Weight 10/17/20 2153 175 lb (79.4 kg)     Height 10/17/20 2153 5\' 8"  (1.727 m)   Constitutional: Alert and oriented.  Eyes: Conjunctivae are normal.  ENT      Head: Normocephalic and atraumatic.      Nose: No congestion/rhinnorhea.      Mouth/Throat: Mucous membranes are moist.      Neck: No stridor. Hematological/Lymphatic/Immunilogical: No cervical lymphadenopathy. Cardiovascular: Normal rate, regular rhythm.  No murmurs, rubs, or gallops.  Respiratory:  Normal respiratory effort without tachypnea nor retractions. Breath sounds are clear and equal bilaterally. No wheezes/rales/rhonchi. Gastrointestinal: Soft and non tender. No rebound. No guarding.  Genitourinary: Deferred Musculoskeletal: Normal range of motion in all extremities. No lower extremity edema. Neurologic:  Normal speech and language. Bilateral upper extremity tremors.  Skin:  Skin is warm, dry and intact. No rash noted. Psychiatric: Does not appear to be responding to internal stimuli on my exam.   ____________________________________________    LABS (pertinent positives/negatives)  Magnesium 1.2 CMP na 132, k 3.5, glu 117, cr 0.72, ast 333, alt 270, t bili 2.3 CBC wbc 6.4, hgb 14.0, plt 65 ____________________________________________   EKG  None  ____________________________________________    RADIOLOGY  CT head No acute abnormality  CXR No active cardiopulmonary disease  ____________________________________________   PROCEDURES  Procedures  ____________________________________________   INITIAL IMPRESSION / ASSESSMENT AND PLAN / ED COURSE  Pertinent labs & imaging results that were available during my care of the patient were reviewed by me and considered in my medical decision making (see chart for details).   Patient presents to the emergency department today because of concerns for memory problems confusion and hallucinations.  Patient does have a history of alcohol abuse and last drink prior to the symptoms occurring that he had a seizure.  On my exam patient does have tremors.  Does not appear to be responding to any internal stimuli.  I do however have concerns for possible delirium tremens.  The patient will undergo CT head given reported history of head trauma and unusual cognition since then.  Will start banana bag.   CT head negative. CXR without rib fractures. Will plan on admission for complicated alcohol withdrawal.    ____________________________________________   FINAL CLINICAL IMPRESSION(S) / ED DIAGNOSES  Final diagnoses:  Hallucinations  Alcohol withdrawal syndrome with complication [facility])     Note: This dictation was prepared with Dragon dictation. Any transcriptional errors that result from this process are unintentional     [facility], MD 10/17/20 [phone]

## 2020-10-18 ENCOUNTER — Other Ambulatory Visit: Payer: Self-pay

## 2020-10-18 DIAGNOSIS — R443 Hallucinations, unspecified: Secondary | ICD-10-CM

## 2020-10-18 LAB — CBC
HCT: 35 % — ABNORMAL LOW (ref 39.0–52.0)
Hemoglobin: 12.5 g/dL — ABNORMAL LOW (ref 13.0–17.0)
MCH: 32.2 pg (ref 26.0–34.0)
MCHC: 35.7 g/dL (ref 30.0–36.0)
MCV: 90.2 fL (ref 80.0–100.0)
Platelets: 67 10*3/uL — ABNORMAL LOW (ref 150–400)
RBC: 3.88 MIL/uL — ABNORMAL LOW (ref 4.22–5.81)
RDW: 19.1 % — ABNORMAL HIGH (ref 11.5–15.5)
WBC: 5.5 10*3/uL (ref 4.0–10.5)
nRBC: 0 % (ref 0.0–0.2)

## 2020-10-18 LAB — RESP PANEL BY RT-PCR (FLU A&B, COVID) ARPGX2
Influenza A by PCR: NEGATIVE
Influenza B by PCR: NEGATIVE
SARS Coronavirus 2 by RT PCR: NEGATIVE

## 2020-10-18 LAB — BASIC METABOLIC PANEL
Anion gap: 9 (ref 5–15)
BUN: 8 mg/dL (ref 6–20)
CO2: 26 mmol/L (ref 22–32)
Calcium: 8.3 mg/dL — ABNORMAL LOW (ref 8.9–10.3)
Chloride: 105 mmol/L (ref 98–111)
Creatinine, Ser: 0.56 mg/dL — ABNORMAL LOW (ref 0.61–1.24)
GFR, Estimated: >60 mL/min (ref >60)
Glucose, Bld: 88 mg/dL (ref 70–99)
Potassium: 3.1 mmol/L — ABNORMAL LOW (ref 3.5–5.1)
Sodium: 140 mmol/L (ref 135–145)

## 2020-10-18 LAB — HEPATIC FUNCTION PANEL
ALT: 224 U/L — ABNORMAL HIGH (ref 0–44)
AST: 268 U/L — ABNORMAL HIGH (ref 15–41)
Albumin: 3.3 g/dL — ABNORMAL LOW (ref 3.5–5.0)
Alkaline Phosphatase: 63 U/L (ref 38–126)
Bilirubin, Direct: 0.6 mg/dL — ABNORMAL HIGH (ref 0.0–0.2)
Indirect Bilirubin: 1.3 mg/dL — ABNORMAL HIGH (ref 0.3–0.9)
Total Bilirubin: 1.9 mg/dL — ABNORMAL HIGH (ref 0.3–1.2)
Total Protein: 6.5 g/dL (ref 6.5–8.1)

## 2020-10-18 LAB — LIPASE, BLOOD: Lipase: 28 U/L (ref 11–51)

## 2020-10-18 LAB — ETHANOL: Alcohol, Ethyl (B): <10 mg/dL (ref <10)

## 2020-10-18 MED ORDER — LORAZEPAM 2 MG/ML IJ SOLN
1.0000 mg | INTRAMUSCULAR | Status: DC | PRN
  Administered 2020-10-18 – 2020-10-19 (×2): 1 mg via INTRAVENOUS
  Filled 2020-10-18 (×2): qty 1

## 2020-10-18 MED ORDER — HALOPERIDOL LACTATE 5 MG/ML IJ SOLN
4.0000 mg | Freq: Four times a day (QID) | INTRAMUSCULAR | Status: DC | PRN
  Administered 2020-10-18: 4 mg via INTRAMUSCULAR
  Filled 2020-10-18: qty 1

## 2020-10-18 MED ORDER — NICOTINE 21 MG/24HR TD PT24
21.0000 mg | MEDICATED_PATCH | Freq: Every day | TRANSDERMAL | Status: DC
  Administered 2020-10-18: 21 mg via TRANSDERMAL
  Filled 2020-10-18 (×2): qty 1

## 2020-10-18 MED ORDER — DIPHENHYDRAMINE HCL 50 MG/ML IJ SOLN
50.0000 mg | Freq: Once | INTRAMUSCULAR | Status: AC
  Administered 2020-10-18: 50 mg via INTRAVENOUS
  Filled 2020-10-18: qty 1

## 2020-10-18 MED ORDER — ZIPRASIDONE MESYLATE 20 MG IM SOLR
10.0000 mg | Freq: Four times a day (QID) | INTRAMUSCULAR | Status: DC | PRN
  Administered 2020-10-18: 10 mg via INTRAMUSCULAR
  Filled 2020-10-18 (×2): qty 20

## 2020-10-18 NOTE — ED Notes (Signed)
Pt not leaving monitors hooked up

## 2020-10-18 NOTE — ED Notes (Signed)
Pt assisted to restroom.  

## 2020-10-18 NOTE — ED Notes (Addendum)
Pt up oob again while this RN was in another pt's room.  Pt's IV was out of arm and blood was seen on the floor.  This RN directed pt back to bed.  Pt was talking to "other people" in the room when there was nobody there.  Pt reminded he was in hospital, and is staying here for alcohol detox.  Dr. Arville Care made aware and will place orders for benadryl and geodon.

## 2020-10-18 NOTE — ED Notes (Addendum)
Pt answering questions appropriately at this time, oriented to person, time and place. Tremor noted. Stretcher locked in lowest position. Placed back on monitor. Bed alarm placed

## 2020-10-18 NOTE — ED Notes (Signed)
Pt got up OOB to go to restroom with this RN assisting.  Pt somewhat steady on feet, but is completely disoriented at this time.

## 2020-10-18 NOTE — ED Notes (Signed)
Transport at bedside  

## 2020-10-18 NOTE — Progress Notes (Signed)
Patient ID: Christian [name], male   DOB: 03-Dec-1980, 40 y.o.   MRN: [id number] patient seen in the emergency room. Continues to undergo alcohol withdrawal tremors and anxiety along with hallucination. Girlfriend at bedside. Patient asking me when he can go home. Explain need for treatment for intoxication. Discussed importance of abstaining from alcohol. Will continue current treatment.

## 2020-10-18 NOTE — ED Notes (Signed)
Pt speaking in incomprehensive sentences. Pt placed back on monitors, call bell in reach, bed alarm in place . Prn ativan given

## 2020-10-18 NOTE — H&P (Signed)
[place]   PATIENT NAME: Christian [name]    MR#:  [id number]  DATE OF BIRTH:  [birth year]  DATE OF ADMISSION:  10/17/2020  PRIMARY CARE PHYSICIAN: Patient, No Pcp Per (Inactive)   Patient is coming from: Home  REQUESTING/REFERRING PHYSICIAN: Phineas Semen, MD  CHIEF COMPLAINT:   Chief Complaint  Patient presents with  . Memory Loss  . Hallucinations    HISTORY OF PRESENT ILLNESS:  Christian [name] is a 40 y.o. male with medical history significant for tobacco and alcohol abuse, who presented to the emergency room with acute onset of hallucinations including auditory and visual once as well as memory issues.  He had a seizure episode 2 days ago.  During this episode he had his head against the furniture.  This was thought to be likely related to alcohol withdrawal.  His last alcoholic drink was 3 days ago.  Since his seizures the family has been noticing that he has been having trouble with memory and concentration they noticed he was responding to internal stimuli and that he thinks he is having conversations with people that are not there as well as occasionally seeing people.  He was complaining of bilateral rib pain.  No fever or chills.  He admits to nausea and vomiting with occasional epigastric abdominal pain.  No jitteriness or nervousness or agitation.  He is interested in being detoxed from alcohol.    ED Course: Upon presentation to the ER, vital signs were within normal except for tachycardia of 112.  Labs revealed borderline potassium at 3.5 and hyponatremia hypokalemia, elevated AST of 333 compared to 115 2 months ago and ALT of 278 compared to 77 then with total bili of 2.3 compared to 1.3 then.  Magnesium level is 1.2.  CBC showed thrombocytopenia of 65 compared to 58 done.  Influenza antigens and COVID-19 PCR came back negative.  Alcohol level came back less than 10.  Imaging: Two-view chest x-ray showed no acute cardiopulmonary disease and head CT scan without  contrast revealed bilateral maxillary sinusitis with no acute intracranial normalities. PAST MEDICAL HISTORY:  -Alcohol and tobacco abuse. - Seizure disorder. - History of thrombocytopenia. PAST SURGICAL HISTORY:   Past Surgical History:  Procedure Laterality Date  . BACK SURGERY      SOCIAL HISTORY:   Social History   Tobacco Use  . Smoking status: Current Every Day Smoker  . Smokeless tobacco: Never Used  Substance Use Topics  . Alcohol use: Yes  He drinks about a fifth/day.  FAMILY HISTORY:   Family History  Problem Relation Age of Onset  . Seizures Neg Hx     DRUG ALLERGIES:  No Known Allergies  REVIEW OF SYSTEMS:   ROS As per history of present illness. All pertinent systems were reviewed above. Constitutional, HEENT, cardiovascular, respiratory, GI, GU, musculoskeletal, neuro, psychiatric, endocrine, integumentary and hematologic systems were reviewed and are otherwise negative/unremarkable except for positive findings mentioned above in the HPI.   MEDICATIONS AT HOME:   Prior to Admission medications   Medication Sig Start Date End Date Taking? Authorizing Provider  Cholecalciferol 25 MCG (1000 UT) tablet Take by mouth. Patient not taking: Reported on 10/17/2020 12/14/19   [provider]  folic acid (FOLVITE) 1 MG tablet Take 1 tablet (1 mg total) by mouth daily. Patient not taking: No sig reported 04/18/19   Alberteen Sam, MD  gabapentin (NEURONTIN) 400 MG capsule Take 400 mg by mouth 3 (three) times daily. Patient not taking:  Reported on 10/17/2020 09/15/20   [provider]  thiamine 100 MG tablet Take 1 tablet (100 mg total) by mouth daily. Patient not taking: No sig reported 04/18/19   Danford, Earl Lites, MD      VITAL SIGNS:  Blood pressure (!) 148/113, pulse 95, temperature 98.5 F (36.9 C), resp. rate 20, height 5\' 8"  (1.727 m), weight 79.4 kg, SpO2 100 %.  PHYSICAL EXAMINATION:  Physical Exam  GENERAL:  40  y.o.-year-old male patient lying in the bed with no acute distress.  EYES: Pupils equal, round, reactive to light and accommodation. No scleral icterus. Extraocular muscles intact.  HEENT: Head atraumatic except for right temporal abrasion, normocephalic. Oropharynx and nasopharynx clear.  NECK:  Supple, no jugular venous distention. No thyroid enlargement, no tenderness.  LUNGS: Normal breath sounds bilaterally, no wheezing, rales,rhonchi or crepitation. No use of accessory muscles of respiration.  CARDIOVASCULAR: Regular rate and rhythm, S1, S2 normal. No murmurs, rubs, or gallops.  ABDOMEN: Soft, nondistended, nontender. Bowel sounds present. No organomegaly or mass.  EXTREMITIES: No pedal edema, cyanosis, or clubbing.  NEUROLOGIC: Cranial nerves II through XII are intact. Muscle strength 5/5 in all extremities. Sensation intact. Gait not checked.  PSYCHIATRIC: The patient is alert and oriented x 3.  Normal affect and good eye contact. SKIN: No obvious rash, lesion, or ulcer.   LABORATORY PANEL:   CBC Recent Labs  Lab 10/17/20 2158  WBC 6.4  HGB 14.0  HCT 38.7*  PLT 65*   ------------------------------------------------------------------------------------------------------------------  Chemistries  Recent Labs  Lab 10/17/20 2158  NA 132*  K 3.5  CL 97*  CO2 23  GLUCOSE 117*  BUN 10  CREATININE 0.72  CALCIUM 9.1  MG 1.2*  AST 333*  ALT 270*  ALKPHOS 80  BILITOT 2.3*   ------------------------------------------------------------------------------------------------------------------  Cardiac Enzymes No results for input(s): TROPONINI in the last 168 hours. ------------------------------------------------------------------------------------------------------------------  RADIOLOGY:  DG Chest 2 View  Result Date: 10/17/2020 CLINICAL DATA:  Rib pain EXAM: CHEST - 2 VIEW COMPARISON:  04/17/2019 FINDINGS: Lungs are clear. No pneumothorax or pleural effusion. Cardiac  size within normal limits. Pulmonary vascularity is normal. No acute bone abnormality. Extensive thoracic fusion hardware again noted. IMPRESSION: No active cardiopulmonary disease. Electronically Signed   By: 04/19/2019 MD   On: 10/17/2020 23:25   CT Head Wo Contrast  Result Date: 10/17/2020 CLINICAL DATA:  Seizure, hallucinations EXAM: CT HEAD WITHOUT CONTRAST TECHNIQUE: Contiguous axial images were obtained from the base of the skull through the vertex without intravenous contrast. COMPARISON:  None. FINDINGS: Brain: Normal anatomic configuration. No abnormal intra or extra-axial mass lesion or fluid collection. No abnormal mass effect or midline shift. No evidence of acute intracranial hemorrhage or infarct. Ventricular size is normal. Cerebellum unremarkable. Vascular: Unremarkable Skull: Intact Sinuses/Orbits: Moderate mucosal thickening is noted within the a maxillary sinuses bilaterally with layering fluid. Remaining paranasal sinuses are clear. Orbits are unremarkable. Other: Mastoid air cells and middle ear cavities are clear. IMPRESSION: No acute intracranial abnormality. Bilateral maxillary sinusitis. Electronically Signed   By: 10/19/2020 MD   On: 10/17/2020 23:24      IMPRESSION AND PLAN:  Active Problems:   Alcohol withdrawal (HCC)  1.  Alcoholic hallucinations including auditory and visual with recent fall and head injury secondary to seizure that is likely alcohol withdrawal seizure.  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 - The patient will be admitted to a medical monitored bed. - He will be placed on seizures  precautions. - CIWA protocol will be provided for alcohol withdrawal. - He will be placed on a banana bag daily. - We will add as needed IM Haldol. - We will place her on p.o. Librium for detox. - Psychiatry consultation will be obtained. - I notified Dr. Toni Amend about the patient. - We will follow LFTs.  2.  Ongoing tobacco  abuse. - She was counseled for smoking cessation.  3.  Elevated blood pressure. That she will be placed on as needed IV labetalol.  DVT prophylaxis: SCDs.  Medical prophylaxis currently contraindicated due to thrombocytopenia. Code Status: full code. Family Communication:  The plan of care was discussed in details with the patient (and family). I answered all questions. The patient agreed to proceed with the above mentioned plan. Further management will depend upon hospital course. Disposition Plan: Back to previous home environment Consults called: Psychiatry consult. All the records are reviewed and case discussed with ED provider.  Status is: Inpatient  Remains inpatient appropriate because:Altered mental status, Ongoing diagnostic testing needed not appropriate for outpatient work up, Unsafe d/c plan, IV treatments appropriate due to intensity of illness or inability to take PO and Inpatient level of care appropriate due to severity of illness   Dispo: The patient is from: Home              Anticipated d/c is to: Home              Patient currently is not medically stable to d/c.   Difficult to place patient No   TOTAL TIME TAKING CARE OF THIS PATIENT: 55 minutes.    Hannah Beat M.D on 10/18/2020 at 1:10 AM  [facility]   From 7 PM-7 AM, contact night-coverage [email or web address]  CC: Primary care physician; Patient, No Pcp Per (Inactive)

## 2020-10-18 NOTE — ED Notes (Signed)
Attempted to draw AM labs on pt.  Pt became agitated when trying to straighten arm out to draw labs and is not redirectable.

## 2020-10-19 LAB — HEPATIC FUNCTION PANEL
ALT: 278 U/L — ABNORMAL HIGH (ref 0–44)
AST: 323 U/L — ABNORMAL HIGH (ref 15–41)
Albumin: 3.5 g/dL (ref 3.5–5.0)
Alkaline Phosphatase: 102 U/L (ref 38–126)
Bilirubin, Direct: 0.5 mg/dL — ABNORMAL HIGH (ref 0.0–0.2)
Indirect Bilirubin: 0.9 mg/dL (ref 0.3–0.9)
Total Bilirubin: 1.4 mg/dL — ABNORMAL HIGH (ref 0.3–1.2)
Total Protein: 6.9 g/dL (ref 6.5–8.1)

## 2020-10-19 MED ORDER — CLONIDINE HCL 0.1 MG PO TABS
0.1000 mg | ORAL_TABLET | Freq: Two times a day (BID) | ORAL | Status: DC
  Filled 2020-10-19: qty 1

## 2020-10-19 MED ORDER — HYDRALAZINE HCL 20 MG/ML IJ SOLN: 10.0000 mg | Freq: Four times a day (QID) | INTRAMUSCULAR | Status: DC | PRN

## 2020-10-19 NOTE — Progress Notes (Addendum)
Patient states he must be discharged due to having an important interview that he must go to.  Patient is able to ambulate in room and tremor has decreased.  PIVs removed prior to leaving.  MD states she will not be discharging patient. Patient signed AMA papers and understands leaving is not recommended.  MD notified of patient decision to leave hospital.

## 2020-10-19 NOTE — Progress Notes (Signed)
Patient's BP has been elevated, was 170/116 at 0120, CIWA score 0 at that time. Christian [name] notified. Given Ativan 1 mg IV PRN to help with BP decrease. Scored from 0-5 during shift due to tremors in hands. Patient states he has job interview today at 2pm and would like to go home then.

## 2020-10-19 NOTE — Discharge Summary (Signed)
Patient was admitted with acute alcohol intoxication. He was started on CIWA protocol. Patient decided to leave AMA therefore no discharge diagnosis or meds will be dictated.

## 2020-10-29 ENCOUNTER — Emergency Department
Admission: EM | Admit: 2020-10-29 | Discharge: 2020-10-30 | Disposition: A | Discharge status: Home / Self Care | Payer: BC Managed Care – PPO | Attending: Emergency Medicine | Admitting: Emergency Medicine

## 2020-10-29 ENCOUNTER — Other Ambulatory Visit: Payer: Self-pay

## 2020-10-29 DIAGNOSIS — F1020 Alcohol dependence, uncomplicated: Secondary | ICD-10-CM | POA: Insufficient documentation

## 2020-10-29 DIAGNOSIS — F1010 Alcohol abuse, uncomplicated: Secondary | ICD-10-CM

## 2020-10-29 DIAGNOSIS — Y908 Blood alcohol level of 240 mg/100 ml or more: Secondary | ICD-10-CM | POA: Insufficient documentation

## 2020-10-29 DIAGNOSIS — F17200 Nicotine dependence, unspecified, uncomplicated: Secondary | ICD-10-CM | POA: Insufficient documentation

## 2020-10-29 DIAGNOSIS — Z20822 Contact with and (suspected) exposure to covid-19: Secondary | ICD-10-CM | POA: Insufficient documentation

## 2020-10-29 LAB — URINE DRUG SCREEN, QUALITATIVE (ARMC ONLY)
Amphetamines, Ur Screen: NOT DETECTED
Barbiturates, Ur Screen: NOT DETECTED
Benzodiazepine, Ur Scrn: NOT DETECTED
Cannabinoid 50 Ng, Ur ~~LOC~~: NOT DETECTED
Cocaine Metabolite,Ur ~~LOC~~: NOT DETECTED
MDMA (Ecstasy)Ur Screen: NOT DETECTED
Methadone Scn, Ur: NOT DETECTED
Opiate, Ur Screen: NOT DETECTED
Phencyclidine (PCP) Ur S: NOT DETECTED
Tricyclic, Ur Screen: NOT DETECTED

## 2020-10-29 LAB — CBC
HCT: 41.8 % (ref 39.0–52.0)
Hemoglobin: 15.2 g/dL (ref 13.0–17.0)
MCH: 31.5 pg (ref 26.0–34.0)
MCHC: 36.4 g/dL — ABNORMAL HIGH (ref 30.0–36.0)
MCV: 86.7 fL (ref 80.0–100.0)
Platelets: 121 10*3/uL — ABNORMAL LOW (ref 150–400)
RBC: 4.82 MIL/uL (ref 4.22–5.81)
RDW: 19.9 % — ABNORMAL HIGH (ref 11.5–15.5)
WBC: 3.3 10*3/uL — ABNORMAL LOW (ref 4.0–10.5)
nRBC: 0 % (ref 0.0–0.2)

## 2020-10-29 LAB — COMPREHENSIVE METABOLIC PANEL
ALT: 161 U/L — ABNORMAL HIGH (ref 0–44)
AST: 275 U/L — ABNORMAL HIGH (ref 15–41)
Albumin: 4.4 g/dL (ref 3.5–5.0)
Alkaline Phosphatase: 139 U/L — ABNORMAL HIGH (ref 38–126)
Anion gap: 16 — ABNORMAL HIGH (ref 5–15)
BUN: 7 mg/dL (ref 6–20)
CO2: 22 mmol/L (ref 22–32)
Calcium: 9 mg/dL (ref 8.9–10.3)
Chloride: 103 mmol/L (ref 98–111)
Creatinine, Ser: 0.63 mg/dL (ref 0.61–1.24)
GFR, Estimated: >60 mL/min (ref >60)
Glucose, Bld: 100 mg/dL — ABNORMAL HIGH (ref 70–99)
Potassium: 4.1 mmol/L (ref 3.5–5.1)
Sodium: 141 mmol/L (ref 135–145)
Total Bilirubin: 0.7 mg/dL (ref 0.3–1.2)
Total Protein: 8.7 g/dL — ABNORMAL HIGH (ref 6.5–8.1)

## 2020-10-29 LAB — ETHANOL: Alcohol, Ethyl (B): 410 mg/dL (ref <10)

## 2020-10-29 MED ORDER — LORAZEPAM 2 MG PO TABS: 0.0000 mg | ORAL_TABLET | Freq: Two times a day (BID) | ORAL | Status: DC

## 2020-10-29 MED ORDER — THIAMINE HCL 100 MG PO TABS
100.0000 mg | ORAL_TABLET | Freq: Every day | ORAL | Status: DC
  Administered 2020-10-30: 100 mg via ORAL
  Filled 2020-10-29: qty 1

## 2020-10-29 MED ORDER — LORAZEPAM 2 MG/ML IJ SOLN: 0.0000 mg | Freq: Two times a day (BID) | INTRAMUSCULAR | Status: DC

## 2020-10-29 MED ORDER — LORAZEPAM 2 MG PO TABS: 0.0000 mg | ORAL_TABLET | Freq: Four times a day (QID) | ORAL | Status: DC

## 2020-10-29 MED ORDER — ONDANSETRON HCL 4 MG PO TABS: 4.0000 mg | ORAL_TABLET | Freq: Three times a day (TID) | ORAL | Status: DC | PRN

## 2020-10-29 MED ORDER — THIAMINE HCL 100 MG/ML IJ SOLN: 100.0000 mg | Freq: Every day | INTRAMUSCULAR | Status: DC

## 2020-10-29 MED ORDER — LORAZEPAM 2 MG/ML IJ SOLN
0.0000 mg | Freq: Four times a day (QID) | INTRAMUSCULAR | Status: DC
  Administered 2020-10-30: 2 mg via INTRAVENOUS
  Filled 2020-10-29: qty 1

## 2020-10-29 NOTE — ED Provider Notes (Signed)
[facility] Emergency Department Provider Note  ____________________________________________   Event Date/Time   First MD Initiated Contact with Patient 10/29/20 2312     (approximate)  I have reviewed the triage vital signs and the nursing notes.   HISTORY  Chief Complaint Alcohol Problem    HPI Christian [name] is a 40 y.o. male with history of alcohol abuse who presents to the emergency department requesting help with detox.  States he drinks 1/5 of liquor a day or sometimes a 12 pack of beer.  States he smokes marijuana occasionally.  States he is hoping to get help because he is tired of having shakes, feeling weak and feeling like his heart is racing.  He denies SI, HI.  Has had previous DTs and withdrawal seizures requiring medical admission.        No past medical history on file.  Patient Active Problem List   Diagnosis Date Noted  . Hallucinations   . Alcohol withdrawal (HCC) 10/17/2020  . Seizure (HCC) 04/17/2019  . Thrombocytopenia (HCC) 04/17/2019  . ETOH abuse 04/17/2019  . Hypokalemia 04/17/2019  . Seizures (HCC) 04/17/2019    Past Surgical History:  Procedure Laterality Date  . BACK SURGERY      Prior to Admission medications   Medication Sig Start Date End Date Taking? Authorizing Provider  Cholecalciferol 25 MCG (1000 UT) tablet Take by mouth. Patient not taking: Reported on 10/17/2020 12/14/19   [provider]  folic acid (FOLVITE) 1 MG tablet Take 1 tablet (1 mg total) by mouth daily. Patient not taking: No sig reported 04/18/19   Alberteen Sam, MD  gabapentin (NEURONTIN) 400 MG capsule Take 400 mg by mouth 3 (three) times daily. Patient not taking: Reported on 10/17/2020 09/15/20   [provider]  thiamine 100 MG tablet Take 1 tablet (100 mg total) by mouth daily. Patient not taking: No sig reported 04/18/19   Danford, Earl Lites, MD    Allergies Patient has no known allergies.  Family  History  Problem Relation Age of Onset  . Seizures Neg Hx     Social History Social History   Tobacco Use  . Smoking status: Current Every Day Smoker  . Smokeless tobacco: Never Used  Substance Use Topics  . Alcohol use: Yes  . Drug use: Yes    Types: Marijuana    Review of Systems Constitutional: No fever. Eyes: No visual changes. ENT: No sore throat. Cardiovascular: Denies chest pain. Respiratory: Denies shortness of breath. Gastrointestinal: No nausea, vomiting, diarrhea. Genitourinary: Negative for dysuria. Musculoskeletal: Negative for back pain. Skin: Negative for rash. Neurological: Negative for focal weakness.  Chronic numbness in both feet.  ____________________________________________   PHYSICAL EXAM:  VITAL SIGNS: ED Triage Vitals  Enc Vitals Group     BP 10/29/20 2125 (!) 157/111     Pulse Rate 10/29/20 2125 (!) 130     Resp 10/29/20 2125 20     Temp 10/29/20 2125 98.6 F (37 C)     Temp Source 10/29/20 2125 Oral     SpO2 10/29/20 2125 95 %     Weight 10/29/20 2144 160 lb (72.6 kg)     Height 10/29/20 2144 5\' 8"  (1.727 m)     Head Circumference --      Peak Flow --      Pain Score 10/29/20 2144 10     Pain Loc --      Pain Edu? --      Excl. in GC? --  CONSTITUTIONAL: Alert and oriented and responds appropriately to questions.  In no distress, intoxicated HEAD: Normocephalic EYES: Conjunctivae clear, pupils appear equal, EOM appear intact ENT: normal nose; moist mucous membranes NECK: Supple, normal ROM CARD: Regular and tachycardic, S1 and S2 appreciated; no murmurs, no clicks, no rubs, no gallops RESP: Normal chest excursion without splinting or tachypnea; breath sounds clear and equal bilaterally; no wheezes, no rhonchi, no rales, no hypoxia or respiratory distress, speaking full sentences ABD/GI: Normal bowel sounds; non-distended; soft, non-tender, no rebound, no guarding, no peritoneal signs, no hepatosplenomegaly BACK: The back appears  normal EXT: Normal ROM in all joints; no deformity noted, no edema; no cyanosis SKIN: Normal color for age and race; warm; no rash on exposed skin NEURO: Moves all extremities equally, normal gait, normal speech, no asterixis PSYCH: The patient's mood and manner are appropriate.  ____________________________________________   LABS (all labs ordered are listed, but only abnormal results are displayed)  Labs Reviewed  COMPREHENSIVE METABOLIC PANEL - Abnormal; Notable for the following components:      Result Value   Glucose, Bld 100 (*)    Total Protein 8.7 (*)    AST 275 (*)    ALT 161 (*)    Alkaline Phosphatase 139 (*)    Anion gap 16 (*)    All other components within normal limits  ETHANOL - Abnormal; Notable for the following components:   Alcohol, Ethyl (B) 410 (*)    All other components within normal limits  CBC - Abnormal; Notable for the following components:   WBC 3.3 (*)    MCHC 36.4 (*)    RDW 19.9 (*)    Platelets 121 (*)    All other components within normal limits  RESP PANEL BY RT-PCR (FLU A&B, COVID) ARPGX2  URINE DRUG SCREEN, QUALITATIVE ([facility] ONLY)  MAGNESIUM   ____________________________________________  EKG   EKG Interpretation  Date/Time:  Saturday October 30 2020 00:30:50 EDT Ventricular Rate:  112 PR Interval:  144 QRS Duration: 86 QT Interval:  342 QTC Calculation: 466 R Axis:   90 Text Interpretation: Sinus tachycardia Rightward axis Borderline ECG Confirmed by Rochele Raring [phone]) on 10/30/2020 12:34:46 AM       ____________________________________________  RADIOLOGY Normajean Baxter [place], personally viewed and evaluated these images (plain radiographs) as part of my medical decision making, as well as reviewing the written report by the radiologist.  ED MD interpretation:    Official radiology report(s): No results found.  ____________________________________________   PROCEDURES  Procedure(s) performed (including Critical  Care):  Procedures   ____________________________________________   INITIAL IMPRESSION / ASSESSMENT AND PLAN / ED COURSE  As part of my medical decision making, I reviewed the following data within the electronic MEDICAL RECORD NUMBER Nursing notes reviewed and incorporated, Labs reviewed , Old chart reviewed and Notes from prior ED visits         Patient here requesting help with alcohol abuse.  Labs consistent with long-term alcohol abuse with elevated liver function test near his baseline.  Alcohol level 410 on arrival.  He denies SI, HI.  Would like inpatient treatment.  Will consult TTS.  We will place him on CIWA protocol.  He is tachycardic here.  Will obtain EKG.  Reports he feels weak and is worried that his electrolytes are abnormal.  Potassium is normal here.  We will add on magnesium level.  Patient is high risk for need medical admission for alcohol withdrawal.  ED PROGRESS  Magnesium normal.  4:55 AM  Pt resting comfortably without any signs of active withdrawal.  Heart rate now in the 90s.  Medically cleared.  I reviewed all nursing notes and pertinent previous records as available.  I have reviewed and interpreted any EKGs, lab and urine results, imaging (as available).  ____________________________________________   FINAL CLINICAL IMPRESSION(S) / ED DIAGNOSES  Final diagnoses:  Alcohol abuse     ED Discharge Orders    None      *Please note:  Christian [name] was evaluated in Emergency Department on 10/29/2020 for the symptoms described in the history of present illness. He was evaluated in the context of the global COVID-19 pandemic, which necessitated consideration that the patient might be at risk for infection with the SARS-CoV-2 virus that causes COVID-19. Institutional protocols and algorithms that pertain to the evaluation of patients at risk for COVID-19 are in a state of rapid change based on information released by regulatory bodies including the CDC and  federal and state organizations. These policies and algorithms were followed during the patient's care in the ED.  Some ED evaluations and interventions may be delayed as a result of limited staffing during and the pandemic.*   Note:  This document was prepared using Dragon voice recognition software and may include unintentional dictation errors.   Verl Whitmore, Layla Maw, DO 10/30/20 [phone]

## 2020-10-29 NOTE — ED Notes (Signed)
Pts belongings placed in belongings bag. Belongings consists of 1 of 1 bag: -Striped shirt-gray and white -blue underwear -black pants -black shoes -wallet -cellphone Pts dressed out by this tech and Ariel, EDT.

## 2020-10-29 NOTE — ED Notes (Signed)
VOL  

## 2020-10-29 NOTE — ED Notes (Signed)
ED Provider at bedside. 

## 2020-10-29 NOTE — ED Triage Notes (Signed)
Pt presents to ER stating he needs help getting over his alcoholism.  Pt states he has been having pain all over.  Pt states he normally drinks pint of hard liquor per day. Last drink was pta to ER.  Pt seen here recently for detox and left ama from hospital.  Pt currently A&O x4 at this time.  No tremors or ams noted at this time.

## 2020-10-29 NOTE — ED Notes (Signed)
Patient received to room 21, accompanied by ED tech.  Ambulatory with steady gait.

## 2020-10-29 NOTE — ED Notes (Signed)
During this RN's interview of patient, he advises that "I want to kick this alochol addicition. And I think I need potassium.  But I have to be [place] here by tomorrow because I have an important job interview and I gotta get this jobLand educated patient that detox is not an overnight deal and that he must participate in the process.  He verbalizes understanding.  Patient denies SI HI AH VH.  Lost drink was around noon today.

## 2020-10-30 LAB — RESP PANEL BY RT-PCR (FLU A&B, COVID) ARPGX2
Influenza A by PCR: NEGATIVE
Influenza B by PCR: NEGATIVE
SARS Coronavirus 2 by RT PCR: NEGATIVE

## 2020-10-30 LAB — MAGNESIUM: Magnesium: 1.9 mg/dL (ref 1.7–2.4)

## 2020-10-30 MED ORDER — LORAZEPAM 1 MG PO TABS
1.0000 mg | ORAL_TABLET | Freq: Once | ORAL | Status: AC
  Administered 2020-10-30: 1 mg via ORAL
  Filled 2020-10-30: qty 1

## 2020-10-30 NOTE — ED Notes (Signed)
Christian [name] cab here to pick up pt. Pt adamant he needs to leave to get to interview he has later today. Confirming with EDP Kinner via secure chat that he is still wanting this RN to d/c pt.

## 2020-10-30 NOTE — ED Notes (Signed)
Pt given meal tray at this time 

## 2020-10-30 NOTE — ED Notes (Signed)
Pt given phone to speak with family member who called.

## 2020-10-30 NOTE — ED Notes (Signed)
Report to megan, rn.  

## 2020-10-30 NOTE — ED Notes (Signed)
Pt reported blood with defecation.  This NT saw bright red blood on toilet paper approx 2"x2". Pt stated that he does not have hemmoroids.

## 2020-10-30 NOTE — ED Notes (Signed)
Verbal okay from EDP Kinner to d/c pt.

## 2020-10-30 NOTE — ED Provider Notes (Signed)
Seen by behavioral team, patient is refusing any placement for detox, he states he wants to go home   Jene Every, MD 10/30/20 1035

## 2020-10-30 NOTE — ED Notes (Signed)
This RN notified that patient with c/o bright red blood on toilet paper after having a bowel movement, EDP made aware, no new orders at this time.

## 2020-10-30 NOTE — ED Notes (Signed)
Cheyenne Adas called for patient at this time.

## 2020-10-30 NOTE — ED Notes (Signed)
EDP Kinner notified via secure chat that pt's BP 140/108 and pulse 124; pt has tremor but is steady when walking.

## 2020-10-30 NOTE — ED Notes (Signed)
Patient is resting comfortably.No acute distress noted.

## 2020-10-30 NOTE — ED Notes (Signed)
Pt steady walking to and from nurses station. Given his belongings to change clothes.

## 2020-10-30 NOTE — BH Assessment (Signed)
Comprehensive Clinical Assessment (CCA) Screening, Triage and Referral Note  10/30/2020 Christian [name] [id number]   Christian [name] is a 40 y.o male who presents to [facility] ED voluntarily for alcohol withdrawal seizure. Per triage note, Pt presents to ER stating he needs help getting over his alcoholism.  Pt states he has been having pain all over.  Pt states he normally drinks pint of hard liquor per day. Last drink was pta to ER.  Pt seen here recently for detox and left ama from hospital.  Pt currently A&O x4 at this time.  No tremors or ams noted at this time.    During TTS assessment pt presents drowsy, anxious and oriented x 4. Pt had tremulous psychomotor activity. The pt does not appear to be responding to internal or external stimuli . Pt's memory was impaired as he was able to verify the events that transpired prior to his arrival. Pt identified his main stressor to be ongoing relationship issues with his girlfriend and finding stable housing. Pt reports some experiences of homelessness but denies being homeless currently. Pt denies sx of depression, but admits to feeling anxious as he repeatedly stated "I need to get back home, I start my new job Monday". Pt is unable to identify a sobriety period but reports unsuccessful attempts to stop drinking in the past, that resulted in siezures. Pt was not receptive to seeking rehab but agreed to pursue OPT for SA at a later time. Pt denies other substance use. Pt reportsa substance abuse treatment encounter throught the [place] in the past but is unable to provide further information at this time.  Pt reports a family hx of SA with alcohol but is unable to provide specifics. Pt denies any current SI/HI/AH/VH and contracted for safety.   Pt does not meet criteria for INPT and agrees to follow up with OPT resources provided    Chief Complaint:  Chief Complaint  Patient presents with  . Alcohol Problem   Visit Diagnosis: Alcohol use disorder, severe   Patient  Reported Information How did you hear about [place]? Self   Referral name: self   Referral phone number: No data recorded Whom do you see for routine medical problems? I don't have a doctor   Practice/Facility Name: No data recorded  Practice/Facility Phone Number: No data recorded  Name of Contact: No data recorded  Contact Number: No data recorded  Contact Fax Number: No data recorded  Prescriber Name: No data recorded  Prescriber Address (if known): No data recorded What Is the Reason for Your Visit/Call Today? withdrawals  How Long Has This Been Causing You Problems? <Week  Have You Recently Been in Any Inpatient Treatment (Hospital/Detox/Crisis Center/28-Day Program)? No   Name/Location of Program/Hospital:No data recorded  How Long Were You There? No data recorded  When Were You Discharged? No data recorded Have You Ever Received Services From [facility] Before? No   Who Do You See at [facility]? No data recorded Have You Recently Had Any Thoughts About Hurting Yourself? No   Are You Planning to Commit Suicide/Harm Yourself At This time?  No  Have you Recently Had Thoughts About Hurting Someone Christian [name]? No   Explanation: No data recorded Have You Used Any Alcohol or Drugs in the Past 24 Hours? Yes   How Long Ago Did You Use Drugs or Alcohol?  No data recorded  What Did You Use and How Much? pint of liquor before arriving to ED  What Do You Feel Would Help  You the Most Today? Alcohol or Drug Use Treatment  Do You Currently Have a Therapist/Psychiatrist? No   Name of Therapist/Psychiatrist: No data recorded  Have You Been Recently Discharged From Any Office Practice or Programs? No   Explanation of Discharge From Practice/Program:  No data recorded    CCA Screening Triage Referral Assessment Type of Contact: Face-to-Face   Is this Initial or Reassessment? No data recorded  Date Telepsych consult ordered in [facility]:  No data recorded  Time Telepsych consult ordered in  [facility]:  No data recorded Patient Reported Information Reviewed? Yes   Patient Left Without Being Seen? No data recorded  Reason for Not Completing Assessment: No data recorded Collateral Involvement: None provided  Does Patient Have a Court Appointed Legal Guardian? No data recorded  Name and Contact of Legal Guardian:  No data recorded If Minor and Not Living with Parent(s), Who has Custody? N/A  Is CPS involved or ever been involved? Never  Is APS involved or ever been involved? Never  Patient Determined To Be At Risk for Harm To Self or Others Based on Review of Patient Reported Information or Presenting Complaint? No   Method: No data recorded  Availability of Means: No data recorded  Intent: No data recorded  Notification Required: No data recorded  Additional Information for Danger to Others Potential:  No data recorded  Additional Comments for Danger to Others Potential:  No data recorded  Are There Guns or Other Weapons in Your Home?  No data recorded   Types of Guns/Weapons: No data recorded   Are These Weapons Safely Secured?                              No data recorded   Who Could Verify You Are Able To Have These Secured:    No data recorded Do You Have any Outstanding Charges, Pending Court Dates, Parole/Probation? No data recorded Contacted To Inform of Risk of Harm To Self or Others: No data recorded Location of Assessment: [facility] ED  Does Patient Present under Involuntary Commitment? No   IVC Papers Initial File Date: No data recorded  [place] of Residence: [place]  Patient Currently Receiving the Following Services: Not Receiving Services   Determination of Need: Emergent (2 hours)   Options For Referral: Outpatient Therapy   Opal Sidles, LCSWA

## 2022-08-14 IMAGING — CT CT HEAD W/O CM
3 series · 16 of 47 positions shown, 19 images · non-contrast
Comparison: None.

CLINICAL DATA: Seizure, hallucinations

EXAM:
CT HEAD WITHOUT CONTRAST
TECHNIQUE: Contiguous axial images were obtained from the base of the skull
through the vertex without intravenous contrast.

[Series 2: head wo · axial · 0.44mm/px · z∈[-90,+55]mm · 10 of 35 slices shown, 13 images]
[im 3/35  brain]
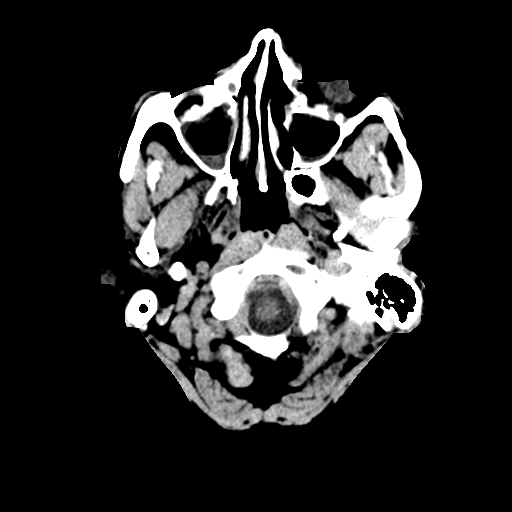
[im 3/35  bone]
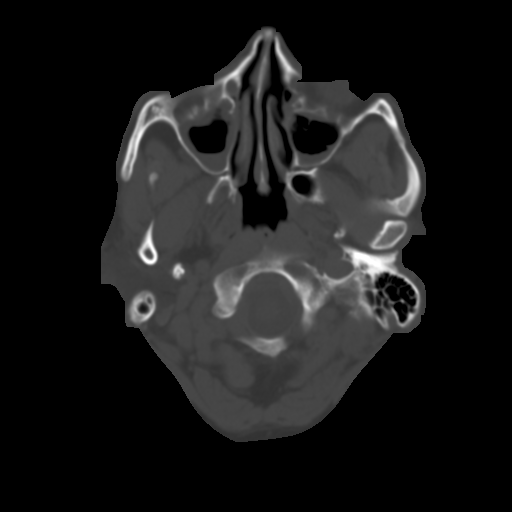
[im 6/35  brain]
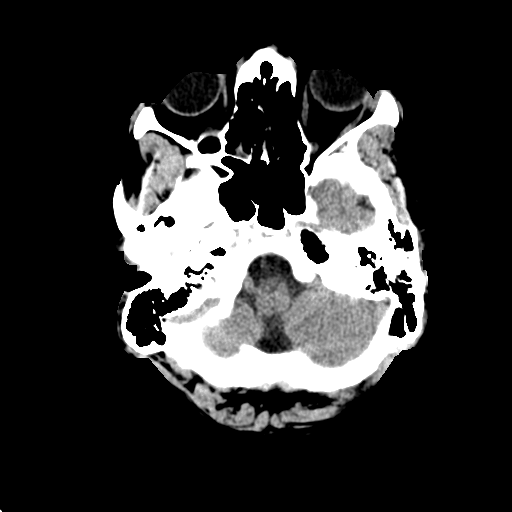
[im 10/35  brain]
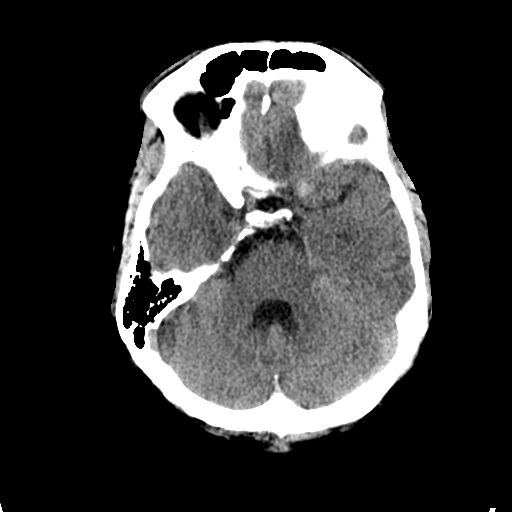
[im 12/35  brain]
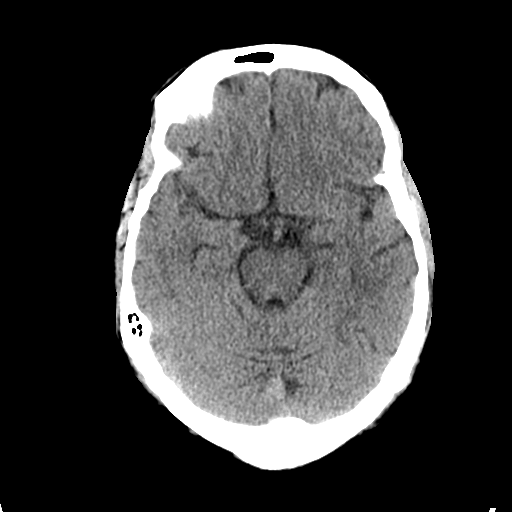
[im 16/35  brain]
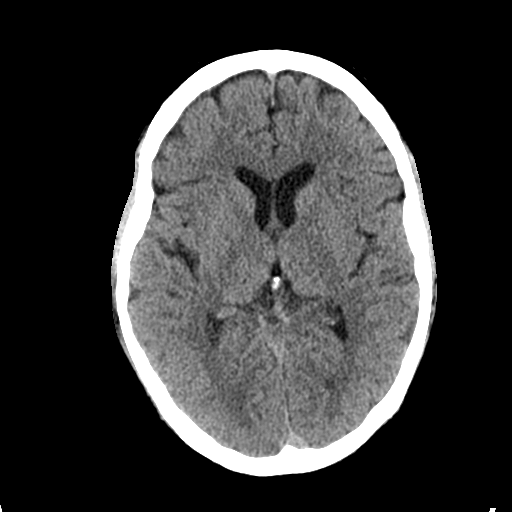
[im 16/35  bone]
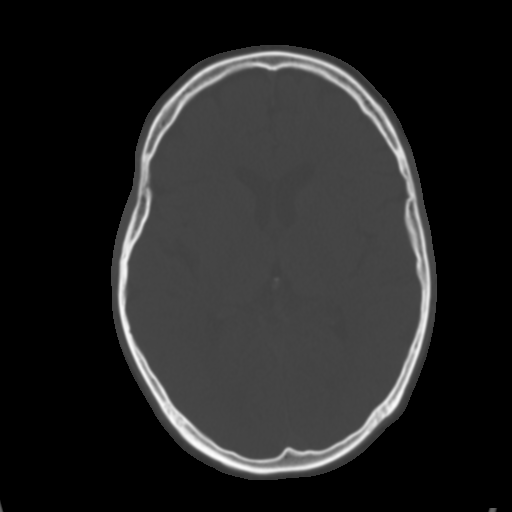
[im 19/35  brain]
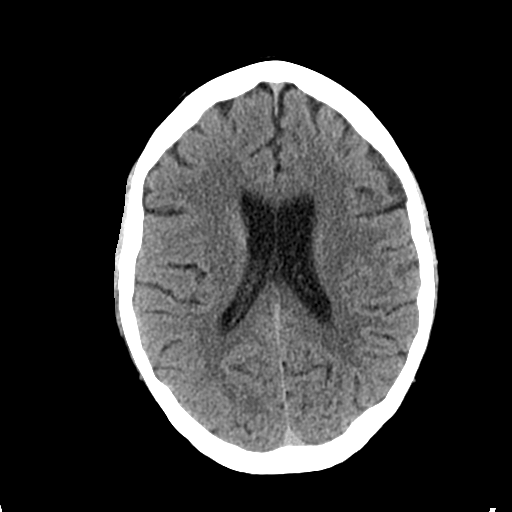
[im 23/35  brain]
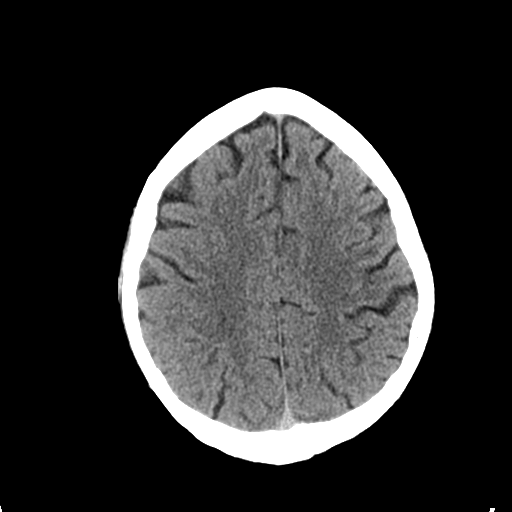
[im 26/35  brain]
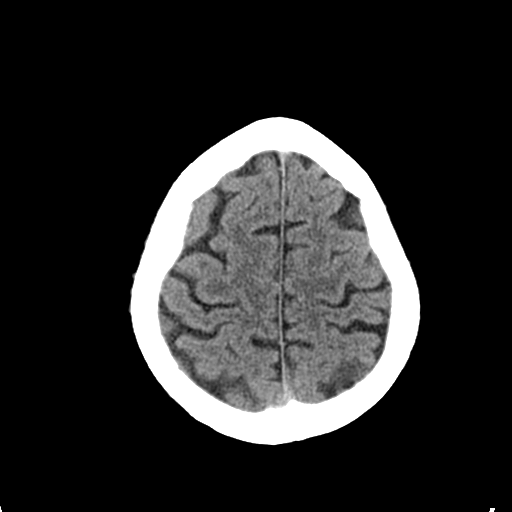
[im 29/35  brain]
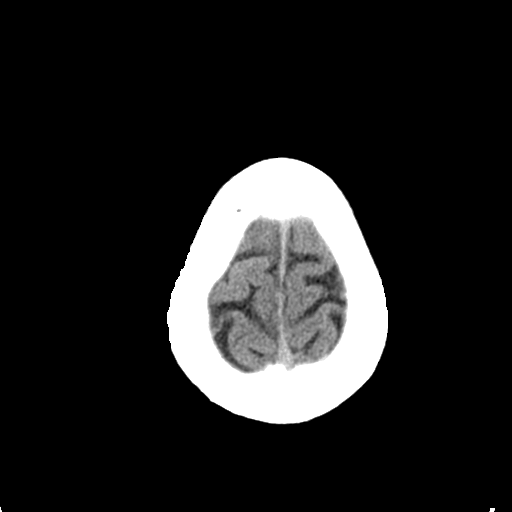
[im 29/35  bone]
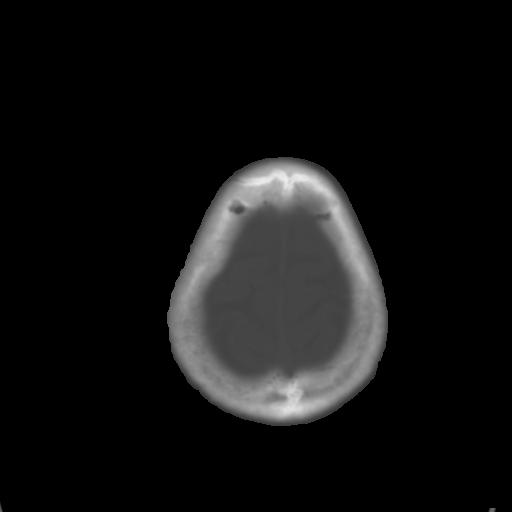
[im 32/35  brain]
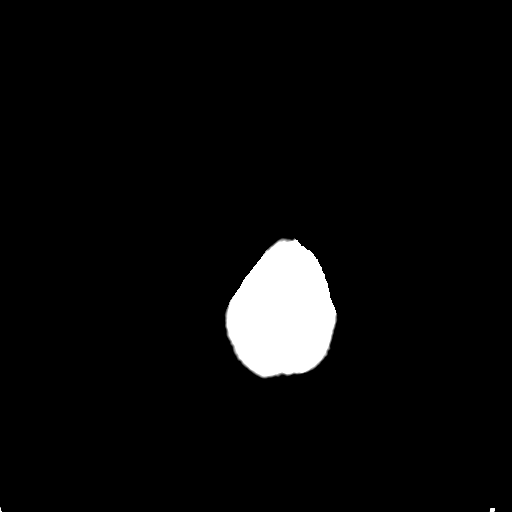

[Series 4: coronal soft tissue · coronal · 0.35mm/px · 3 of 70 slices shown]
[im 24/70  brain]
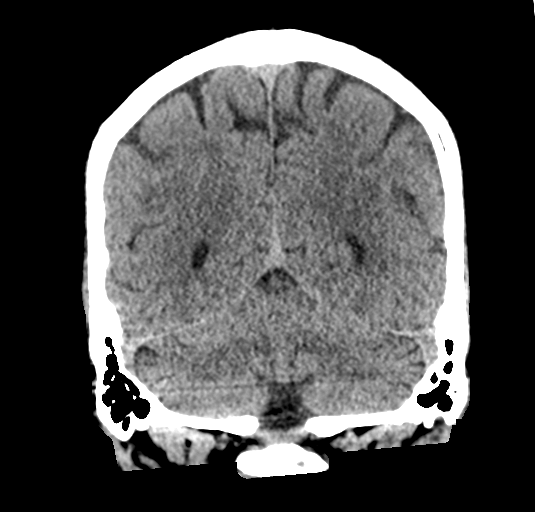
[im 31/70  brain]
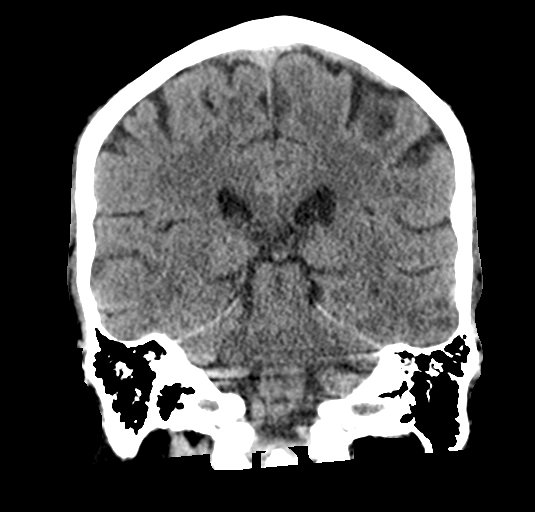
[im 39/70  brain]
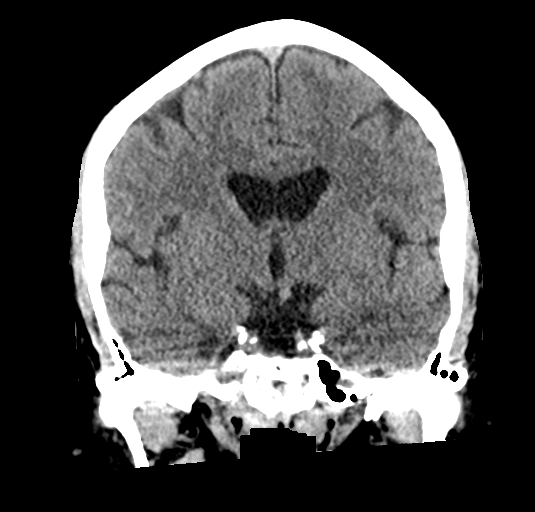

[Series 5: sagittal soft tissue · sagittal · 0.36mm/px · 3 of 59 slices shown]
[im 20/59  brain]
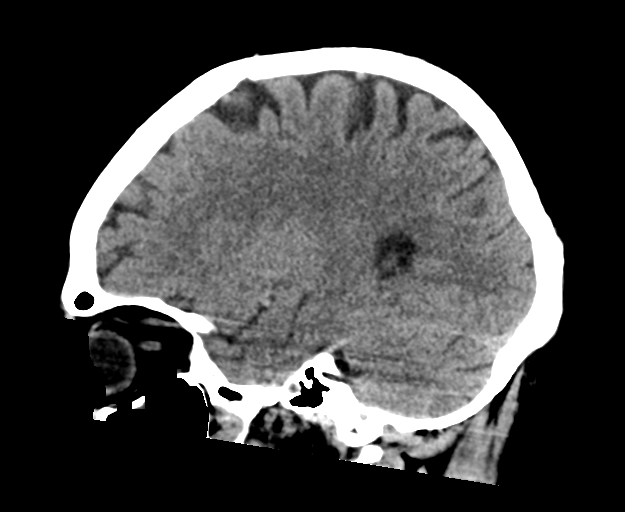
[im 30/59  brain]
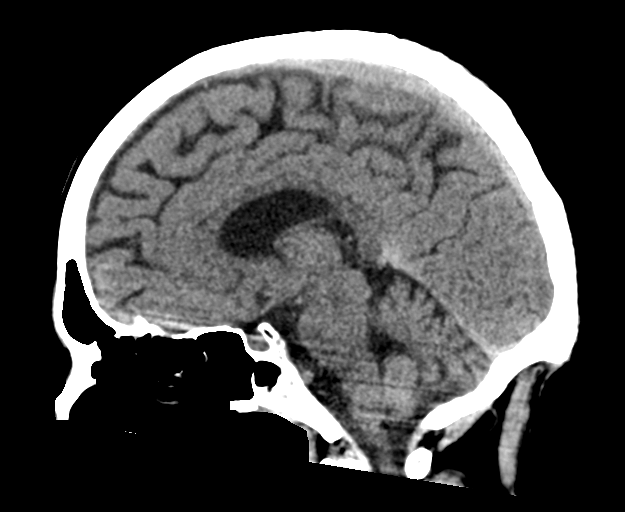
[im 39/59  brain]
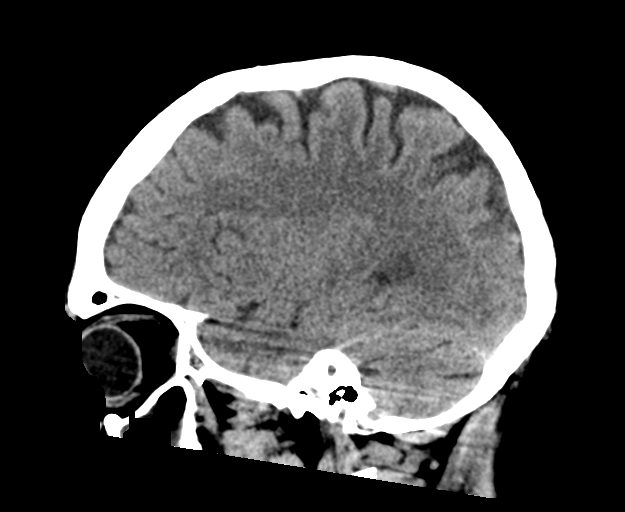

[16 of 47 positions shown; findings below may reference images not displayed]

FINDINGS: Brain: Normal anatomic configuration. No abnormal intra or
extra-axial mass lesion or fluid collection. No abnormal mass effect
or midline shift. No evidence of acute intracranial hemorrhage or
infarct. Ventricular size is normal. Cerebellum unremarkable.

Vascular: Unremarkable

Skull: Intact

Sinuses/Orbits: Moderate mucosal thickening is noted within the a
maxillary sinuses bilaterally with layering fluid. Remaining
paranasal sinuses are clear. Orbits are unremarkable.

Other: Mastoid air cells and middle ear cavities are clear.
IMPRESSION: No acute intracranial abnormality.

Bilateral maxillary sinusitis.

## 2022-12-12 ENCOUNTER — Emergency Department
Admission: EM | Admit: 2022-12-12 | Discharge: 2022-12-12 | Discharge status: Against Medical Advice | Payer: Medicaid Other | Attending: Emergency Medicine | Admitting: Emergency Medicine

## 2022-12-12 ENCOUNTER — Other Ambulatory Visit: Payer: Self-pay

## 2022-12-12 DIAGNOSIS — Z5329 Procedure and treatment not carried out because of patient's decision for other reasons: Secondary | ICD-10-CM | POA: Diagnosis not present

## 2022-12-12 DIAGNOSIS — F10939 Alcohol use, unspecified with withdrawal, unspecified: Secondary | ICD-10-CM | POA: Diagnosis not present

## 2022-12-12 DIAGNOSIS — W19XXXA Unspecified fall, initial encounter: Secondary | ICD-10-CM | POA: Diagnosis not present

## 2022-12-12 DIAGNOSIS — I10 Essential (primary) hypertension: Secondary | ICD-10-CM | POA: Diagnosis not present

## 2022-12-12 DIAGNOSIS — R000 Tachycardia, unspecified: Secondary | ICD-10-CM | POA: Diagnosis not present

## 2022-12-12 DIAGNOSIS — R55 Syncope and collapse: Secondary | ICD-10-CM | POA: Diagnosis present

## 2022-12-12 DIAGNOSIS — S0990XA Unspecified injury of head, initial encounter: Secondary | ICD-10-CM | POA: Diagnosis not present

## 2022-12-12 LAB — COMPREHENSIVE METABOLIC PANEL
ALT: 266 U/L — ABNORMAL HIGH (ref 0–44)
AST: 316 U/L — ABNORMAL HIGH (ref 15–41)
Albumin: 4.4 g/dL (ref 3.5–5.0)
Alkaline Phosphatase: 72 U/L (ref 38–126)
Anion gap: 22 — ABNORMAL HIGH (ref 5–15)
BUN: 11 mg/dL (ref 6–20)
CO2: 13 mmol/L — ABNORMAL LOW (ref 22–32)
Calcium: 9 mg/dL (ref 8.9–10.3)
Chloride: 91 mmol/L — ABNORMAL LOW (ref 98–111)
Creatinine, Ser: 0.92 mg/dL (ref 0.61–1.24)
GFR, Estimated: >60 mL/min (ref >60)
Glucose, Bld: 138 mg/dL — ABNORMAL HIGH (ref 70–99)
Potassium: 3.9 mmol/L (ref 3.5–5.1)
Sodium: 126 mmol/L — ABNORMAL LOW (ref 135–145)
Total Bilirubin: 1.2 mg/dL (ref 0.3–1.2)
Total Protein: 8.6 g/dL — ABNORMAL HIGH (ref 6.5–8.1)

## 2022-12-12 LAB — CBC
HCT: 49.9 % (ref 39.0–52.0)
Hemoglobin: 16.7 g/dL (ref 13.0–17.0)
MCH: 27.4 pg (ref 26.0–34.0)
MCHC: 33.5 g/dL (ref 30.0–36.0)
MCV: 81.8 fL (ref 80.0–100.0)
Platelets: 78 10*3/uL — ABNORMAL LOW (ref 150–400)
RBC: 6.1 MIL/uL — ABNORMAL HIGH (ref 4.22–5.81)
RDW: 17.7 % — ABNORMAL HIGH (ref 11.5–15.5)
WBC: 5.3 10*3/uL (ref 4.0–10.5)
nRBC: 0 % (ref 0.0–0.2)

## 2022-12-12 LAB — TROPONIN I (HIGH SENSITIVITY): Troponin I (High Sensitivity): 6 ng/L (ref <18)

## 2022-12-12 MED ORDER — SODIUM CHLORIDE 0.9 % IV SOLN: Freq: Once | INTRAVENOUS | Status: AC

## 2022-12-12 MED ORDER — LORAZEPAM 2 MG/ML IJ SOLN
2.0000 mg | Freq: Once | INTRAMUSCULAR | Status: AC
  Administered 2022-12-12: 2 mg via INTRAVENOUS
  Filled 2022-12-12: qty 1

## 2022-12-12 MED ORDER — CHLORDIAZEPOXIDE HCL 25 MG PO CAPS
ORAL_CAPSULE | ORAL | 0 refills | Status: AC
  Filled 2022-12-12: qty 11, 4d supply, fill #0

## 2022-12-12 MED ORDER — NICOTINE 14 MG/24HR TD PT24
14.0000 mg | MEDICATED_PATCH | Freq: Once | TRANSDERMAL | Status: DC
  Administered 2022-12-12: 14 mg via TRANSDERMAL
  Filled 2022-12-12: qty 1

## 2022-12-12 NOTE — ED Triage Notes (Signed)
From work, pt went to bathroom and came back with a syncopal episode and hit the back of his head. Does not remember anything that happened, took a minute to know where he was. Coworkers said he drinks alcohol and overheard him talking about having a drug dealer. Pt denies drinking or taking any drugs. Last drink was 3 beers yesterday, states he is not supposed to be drinking. Pt states hx of seizures. BP 149/102 hx of HTN HR 141 ST 96% RA CBG 98

## 2022-12-12 NOTE — ED Provider Notes (Signed)
[facility] Provider Note    Event Date/Time   First MD Initiated Contact with Patient 12/12/22 1006     (approximate)   History   Loss of Consciousness   HPI  Christian [name] is a 42 y.o. male who presents after a reported syncopal episode, although he thinks it may have been a seizure.  He reports he was working in the heat, last drank alcohol yesterday.  He thinks he is having alcohol withdrawal.  Reports a history of seizure from alcohol withdrawal in the past.  Denies injury.  No chest pain.  No abdominal pain.  He has told me from the beginning that he is not to be admitted to the hospital because he just got a new job and he cannot afford to lose with     Physical Exam   Triage Vital Signs: ED Triage Vitals  Encounter Vitals Group     BP 12/12/22 1015 (!) 146/106     Systolic BP Percentile --      Diastolic BP Percentile --      Pulse Rate 12/12/22 1015 (!) 142     Resp 12/12/22 1015 14     Temp 12/12/22 1014 98.9 F (37.2 C)     Temp Source 12/12/22 1014 Oral     SpO2 12/12/22 1015 97 %     Weight 12/12/22 1013 88.5 kg (195 lb)     Height 12/12/22 1013 1.753 m (5\' 9" )     Head Circumference --      Peak Flow --      Pain Score 12/12/22 1013 0     Pain Loc --      Pain Education --      Exclude from Growth Chart --     Most recent vital signs: Vitals:   12/12/22 1130 12/12/22 1145  BP: (!) 132/101   Pulse: (!) 116   Resp:  (!) 25  Temp:    SpO2: 95%      General: Awake, no distress.  CV:  Good peripheral perfusion.  Significant tachycardia, heart rate 130 on the monitor Resp:  Normal effort.  Abd:  No distention.  Other:  No loss of bladder continence, no intraoral injuries   ED Results / Procedures / Treatments   Labs (all labs ordered are listed, but only abnormal results are displayed) Labs Reviewed  CBC - Abnormal; Notable for the following components:      Result Value   RBC 6.10 (*)    RDW 17.7 (*)    Platelets  78 (*)    All other components within normal limits  COMPREHENSIVE METABOLIC PANEL - Abnormal; Notable for the following components:   Sodium 126 (*)    Chloride 91 (*)    CO2 13 (*)    Glucose, Bld 138 (*)    Total Protein 8.6 (*)    AST 316 (*)    ALT 266 (*)    Anion gap 22 (*)    All other components within normal limits  TROPONIN I (HIGH SENSITIVITY)     EKG  ED ECG REPORT I, Jene Every, the attending physician, personally viewed and interpreted this ECG.  Date: 12/12/2022  Rhythm: Sinus tachycardia QRS Axis: normal Intervals: normal ST/T Wave abnormalities: normal Narrative Interpretation: no evidence of acute ischemia    RADIOLOGY     PROCEDURES:  Critical Care performed: yes  CRITICAL CARE Performed by: Jene Every 3  Total critical care time: 30 minutes  Critical  care time was exclusive of separately billable procedures and treating other patients.  Critical care was necessary to treat or prevent imminent or life-threatening deterioration.  Critical care was time spent personally by me on the following activities: development of treatment plan with patient and/or surrogate as well as nursing, discussions with consultants, evaluation of patient's response to treatment, examination of patient, obtaining history from patient or surrogate, ordering and performing treatments and interventions, ordering and review of laboratory studies, ordering and review of radiographic studies, pulse oximetry and re-evaluation of patient's condition.   Procedures   MEDICATIONS ORDERED IN ED: Medications  nicotine (NICODERM CQ - dosed in mg/24 hours) patch 14 mg (14 mg Transdermal Patch Applied 12/12/22 1031)  LORazepam (ATIVAN) injection 2 mg (2 mg Intravenous Given 12/12/22 1033)  0.9 %  sodium chloride infusion (0 mLs Intravenous Stopped 12/12/22 1201)  0.9 %  sodium chloride infusion ( Intravenous New Bag/Given 12/12/22 1111)     IMPRESSION / MDM / ASSESSMENT  AND PLAN / ED COURSE  I reviewed the triage vital signs and the nursing notes. Patient's presentation is most consistent with acute presentation with potential threat to life or bodily function.  Patient presents after a syncopal versus seizure episode, he is markedly tachycardic, does admit to last alcohol use was yesterday, was working in the heat.  Strongly suspicious for alcohol withdrawal seizure versus syncope versus heat exhaustion  Significant tachycardia and history of alcohol withdrawal with seizures will give 2 mg of IV Ativan.  IV fluids ----------------------------------------- 12:07 PM on 12/12/2022 -----------------------------------------  Patient feeling well, is asking to leave.  I once again offered admission given alcohol withdrawal with likely seizure and he has declined.  He has decisional capacity and is leaving AGAINST MEDICAL ADVICE      FINAL CLINICAL IMPRESSION(S) / ED DIAGNOSES   Final diagnoses:  Alcohol withdrawal syndrome with complication (HCC)     Rx / DC Orders   ED Discharge Orders     None        Note:  This document was prepared using Dragon voice recognition software and may include unintentional dictation errors.   Jene Every, MD 12/12/22 [phone]

## 2022-12-12 NOTE — ED Notes (Signed)
Pt in bed, pt states that he wants to go home, states that the md talked with him about staying in the hospital, but he needs to leave because he has to go to work, pt signed ama paperwork, explained to pt that he can come back and get care at any point.  Pt verbalized understanding.  Removed nicotine patch, pt states that he plans on having a cigarette when he leaves. Pt ambulatory from department.

## 2022-12-22 ENCOUNTER — Ambulatory Visit (INDEPENDENT_AMBULATORY_CARE_PROVIDER_SITE_OTHER): Payer: 59 | Admitting: Family Medicine

## 2022-12-22 VITALS — BP 134/98 | HR 112 | Ht 69.0 in

## 2022-12-22 DIAGNOSIS — Z59819 Housing instability, housed unspecified: Secondary | ICD-10-CM

## 2022-12-22 DIAGNOSIS — I158 Other secondary hypertension: Secondary | ICD-10-CM | POA: Diagnosis not present

## 2022-12-22 DIAGNOSIS — Z981 Arthrodesis status: Secondary | ICD-10-CM

## 2022-12-22 DIAGNOSIS — D696 Thrombocytopenia, unspecified: Secondary | ICD-10-CM | POA: Diagnosis not present

## 2022-12-22 DIAGNOSIS — R748 Abnormal levels of other serum enzymes: Secondary | ICD-10-CM | POA: Diagnosis not present

## 2022-12-22 DIAGNOSIS — E871 Hypo-osmolality and hyponatremia: Secondary | ICD-10-CM | POA: Diagnosis not present

## 2022-12-22 DIAGNOSIS — Z562 Threat of job loss: Secondary | ICD-10-CM

## 2022-12-22 DIAGNOSIS — F1024 Alcohol dependence with alcohol-induced mood disorder: Secondary | ICD-10-CM

## 2022-12-22 DIAGNOSIS — R569 Unspecified convulsions: Secondary | ICD-10-CM

## 2022-12-22 DIAGNOSIS — F17200 Nicotine dependence, unspecified, uncomplicated: Secondary | ICD-10-CM | POA: Diagnosis not present

## 2022-12-22 DIAGNOSIS — G8928 Other chronic postprocedural pain: Secondary | ICD-10-CM | POA: Diagnosis not present

## 2022-12-22 DIAGNOSIS — Z5941 Food insecurity: Secondary | ICD-10-CM

## 2022-12-22 MED ORDER — FOLIC ACID 1 MG PO TABS: 1.0000 mg | ORAL_TABLET | Freq: Every day | ORAL | 3 refills | Status: AC

## 2022-12-22 MED ORDER — DULOXETINE HCL 60 MG PO CPEP: 60.0000 mg | ORAL_CAPSULE | Freq: Two times a day (BID) | ORAL | 3 refills | Status: AC

## 2022-12-22 MED ORDER — THIAMINE HCL 100 MG PO TABS: 100.0000 mg | ORAL_TABLET | Freq: Every day | ORAL | 3 refills | Status: AC

## 2022-12-22 MED ORDER — ACAMPROSATE CALCIUM 333 MG PO TBEC: 666.0000 mg | DELAYED_RELEASE_TABLET | Freq: Three times a day (TID) | ORAL | 0 refills | Status: DC

## 2022-12-22 MED ORDER — TRAMADOL HCL 50 MG PO TABS: 100.0000 mg | ORAL_TABLET | Freq: Four times a day (QID) | ORAL | 0 refills | Status: AC | PRN

## 2022-12-22 MED ORDER — GABAPENTIN 600 MG PO TABS: 600.0000 mg | ORAL_TABLET | Freq: Three times a day (TID) | ORAL | 3 refills | Status: AC

## 2022-12-22 MED ORDER — VITAMIN D3 50 MCG (2000 UT) PO CAPS: 2000.0000 [IU] | ORAL_CAPSULE | Freq: Every day | ORAL | 3 refills | Status: AC

## 2022-12-22 NOTE — Assessment & Plan Note (Signed)
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

## 2022-12-22 NOTE — Assessment & Plan Note (Signed)
In setting of EtOH misuse; continue to monitor Slight tachycardia on exam

## 2022-12-22 NOTE — Assessment & Plan Note (Addendum)
Completed ~2017; confirmed on xray Continue to recommend consult back to neurosurgery and chronic pain going forward  Recommend use of 1000 mg Tylenol q8 hours in addition to tramadol as needed Verbal consent binding; patient denies concern for suicidality or previous misuse of opioids  Advised that opioids and alcohol can both reduce respiratory drive and patient could have a life threatening event if he was to misuse. PDMP reviewed

## 2022-12-22 NOTE — Progress Notes (Signed)
New patient visit  Patient: Christian [name]   DOB: 19-Oct-1980   42 y.o. Male  MRN: [id number] Visit Date: 12/22/2022  Today's healthcare provider: Jacky Kindle, FNP  Patient presents for new patient visit to establish care.  Introduced to Publishing rights manager role and practice setting.  All questions answered.  Discussed provider/patient relationship and expectations.  Chief Complaint  Patient presents with   New Patient (Initial Visit)    Patient reports he needs a doctor note for work due to having pains at work after a few hours and can only sit down with doctors note. Would also like to know if it is something else he should take for pain. Reports he was taking bc powders because it would help quick but he has started to notice blood in his stools so figured he should stop.    Subjective    Christian [name] is a 42 y.o. male who presents today as a new patient to establish care.   HPI HPI     New Patient (Initial Visit)    Additional comments: Patient reports he needs a doctor note for work due to having pains at work after a few hours and can only sit down with doctors note. Would also like to know if it is something else he should take for pain. Reports he was taking bc powders because it would help quick but he has started to notice blood in his stools so figured he should stop.       Last edited by Acey Lav, CMA on 12/22/2022  9:18 AM.      Past Medical History:  Diagnosis Date   Hypertension Na   Past Surgical History:  Procedure Laterality Date   BACK SURGERY     SPINE SURGERY  2017   Family Status  Relation Name Status   Neg Hx  (Not Specified)  No partnership data on file   Family History  Problem Relation Age of Onset   Seizures Neg Hx    Social History   Socioeconomic History   Marital status: Single    Spouse name: Not on file   Number of children: Not on file   Years of education: Not on file   Highest education level: 12th grade  Occupational  History   Not on file  Tobacco Use   Smoking status: Every Day    Current packs/day: 1.00    Average packs/day: 1 pack/day for 15.0 years (15.0 ttl pk-yrs)    Types: Cigarettes   Smokeless tobacco: Never  Substance and Sexual Activity   Alcohol use: Yes    Alcohol/week: 6.0 standard drinks of alcohol    Types: 6 Cans of beer per week   Drug use: Not Currently    Types: Marijuana   Sexual activity: Not Currently    Birth control/protection: None  Other Topics Concern   Not on file  Social History Narrative   Not on file   Social Determinants of Health   Financial Resource Strain: Medium Risk (12/22/2022)   Overall Financial Resource Strain (CARDIA)    Difficulty of Paying Living Expenses: Somewhat hard  Food Insecurity: Food Insecurity Present (12/22/2022)   Hunger Vital Sign    Worried About Running Out of Food in the Last Year: Sometimes true    Ran Out of Food in the Last Year: Patient declined  Transportation Needs: Unmet Transportation Needs (12/22/2022)   PRAPARE - Transportation    Lack of Transportation (Medical): Yes    Lack of  Transportation (Non-Medical): Yes  Physical Activity: Sufficiently Active (12/22/2022)   Exercise Vital Sign    Days of Exercise per Week: 3 days    Minutes of Exercise per Session: 90 min  Stress: Stress Concern Present (12/22/2022)   [employer] of Occupational Health - Occupational Stress Questionnaire    Feeling of Stress : Rather much  Social Connections: Unknown (12/22/2022)   Social Connection and Isolation Panel [NHANES]    Frequency of Communication with Friends and Family: Once a week    Frequency of Social Gatherings with Friends and Family: Never    Attends Religious Services: 1 to 4 times per year    Active Member of [employer] or Organizations: No    Attends Engineer, structural: Not on file    Marital Status: Patient declined   Outpatient Medications Prior to Visit  Medication Sig   [DISCONTINUED] Cholecalciferol  25 MCG (1000 UT) tablet Take by mouth. (Patient not taking: Reported on 10/17/2020)   [DISCONTINUED] folic acid (FOLVITE) 1 MG tablet Take 1 tablet (1 mg total) by mouth daily. (Patient not taking: Reported on 10/17/2020)   [DISCONTINUED] gabapentin (NEURONTIN) 400 MG capsule Take 400 mg by mouth 3 (three) times daily. (Patient not taking: Reported on 10/17/2020)   [DISCONTINUED] thiamine 100 MG tablet Take 1 tablet (100 mg total) by mouth daily. (Patient not taking: Reported on 10/17/2020)   No facility-administered medications prior to visit.   No Known Allergies  Immunization History  Administered Date(s) Administered   Hepatitis A, Adult 07/20/2000, 05/13/2001   Hepatitis B, ADULT 12/27/2000, 05/13/2001, 12/25/2001   IPV 07/20/2000   Influenza Nasal 05/15/2006   Influenza Split 05/13/2004, 05/23/2005   Influenza Whole 07/20/2000, 05/13/2001, 03/26/2002   Influenza,inj,Quad PF,6+ Mos 05/13/2019   Janssen (J&J) SARS-COV-2 Vaccination 10/07/2019   Meningococcal polysaccharide vaccine (MPSV4) 07/20/2000   PNEUMOCOCCAL CONJUGATE-20 11/19/2020   Pneumococcal Polysaccharide-23 05/13/2019   Td 12/27/2000   Tdap 05/13/2019   Typhoid Inactivated 12/27/2000   Yellow Fever 12/27/2000    Health Maintenance  Topic Date Due   COVID-19 Vaccine (2 - 2023-24 season) 01/27/2022   INFLUENZA VACCINE  12/28/2022   DTaP/Tdap/Td (3 - Td or Tdap) 05/12/2029   Hepatitis C Screening  Completed   HIV Screening  Completed   HPV VACCINES  Aged Out    Patient Care Team: Jacky Kindle, FNP as PCP - General (Family Medicine)  Review of Systems  Last CBC Lab Results  Component Value Date   WBC 5.3 12/12/2022   HGB 16.7 12/12/2022   HCT 49.9 12/12/2022   MCV 81.8 12/12/2022   MCH 27.4 12/12/2022   RDW 17.7 (H) 12/12/2022   PLT 78 (L) 12/12/2022   Last metabolic panel Lab Results  Component Value Date   GLUCOSE 138 (H) 12/12/2022   NA 126 (L) 12/12/2022   K 3.9 12/12/2022   CL 91 (L)  12/12/2022   CO2 13 (L) 12/12/2022   BUN 11 12/12/2022   CREATININE 0.92 12/12/2022   GFRNONAA >60 12/12/2022   CALCIUM 9.0 12/12/2022   PHOS 3.1 08/24/2020   PROT 8.6 (H) 12/12/2022   ALBUMIN 4.4 12/12/2022   BILITOT 1.2 12/12/2022   ALKPHOS 72 12/12/2022   AST 316 (H) 12/12/2022   ALT 266 (H) 12/12/2022   ANIONGAP 22 (H) 12/12/2022   Last lipids No results found for: "CHOL", "HDL", "LDLCALC", "LDLDIRECT", "TRIG", "CHOLHDL" Last hemoglobin A1c Lab Results  Component Value Date   HGBA1C 5.5 04/17/2019   Last thyroid functions No results  found for: "TSH", "T3TOTAL", "T4TOTAL", "THYROIDAB" Last vitamin D No results found for: "25OHVITD2", "25OHVITD3", "VD25OH"  Last vitamin B12 and Folate Lab Results  Component Value Date   VITAMINB12 287 04/17/2019      Objective    BP (!) 134/98 (BP Location: Right Arm, Patient Position: Sitting, Cuff Size: Normal)   Pulse (!) 112   Ht 5\' 9"  (1.753 m)   SpO2 100%   BMI 28.80 kg/m  BP Readings from Last 3 Encounters:  12/22/22 (!) 134/98  12/12/22 (!) 151/110  10/30/20 (!) 140/108   Wt Readings from Last 3 Encounters:  12/12/22 195 lb (88.5 kg)  10/29/20 160 lb (72.6 kg)  10/17/20 175 lb (79.4 kg)   SpO2 Readings from Last 3 Encounters:  12/22/22 100%  12/12/22 99%  10/30/20 97%     Physical Exam Vitals and nursing note reviewed.  Constitutional:      Appearance: Normal appearance. He is overweight.  HENT:     Head: Normocephalic and atraumatic.  Eyes:     Comments: Wears eyeglasses; however, recently broken  Cardiovascular:     Rate and Rhythm: Normal rate and regular rhythm.     Pulses: Normal pulses.     Heart sounds: Normal heart sounds.  Pulmonary:     Effort: Pulmonary effort is normal.     Breath sounds: Normal breath sounds.  Musculoskeletal:        General: Deformity present. Normal range of motion.     Cervical back: Normal range of motion.     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     Capillary Refill: Capillary refill takes less than 2 seconds.  Neurological:     General: No focal deficit present.     Mental Status: He is alert and oriented to person, place, and time. Mental status is at baseline.  Psychiatric:        Mood and Affect: Mood normal.        Behavior: Behavior normal.        Thought Content: Thought content normal.        Judgment: Judgment normal.    Depression Screen    12/22/2022    9:14 AM  PHQ 2/9 Scores  PHQ - 2 Score 2  PHQ- 9 Score 8   No results found for any visits on 12/22/22.  Assessment & Plan      Problem List Items Addressed This Visit       Cardiovascular and Mediastinum   Other secondary hypertension    Elevated blood pressure, likely in setting of alcohol misuse/alcohol dependence Continue to focus on alcohol reduction with goal of cessation given concern for dependency and use in setting of chronic uncontrolled upper back pain       Relevant Orders   AMB Referral to Managed Medicaid Care Management     Nervous and Auditory   Alcohol dependence with alcohol-induced mood disorder (HCC)    Chronic, variable use Recommend use of campral to assist given previous use of librium; defer additional librium at this time Encouraged not to quit overnight given concern for repeat seizures and not on anti-epileptic at this time       Relevant Orders   AMB Referral to Managed Medicaid Care Management     Hematopoietic and Hemostatic   Thrombocytopenia (HCC)    Chronic, in setting of alcohol use disorder Patient advised of lab findings from  hospital stay and recommend future reduction of EtOH to assist with recovery of labs       Relevant Orders   AMB Referral to Managed Medicaid Care Management     Other   Abnormal transaminases    Previously noted in setting of EtOH use/misuse       Relevant Orders   AMB Referral to Managed Medicaid Care  Management   Food insecurity    Food insecurity noted given job instability Continue to monitor       Housing instability    [facility]; previously lost his housing after loss of job      Hyponatremia    In setting of EtOH misuse; continue to monitor Slight tachycardia on exam      Relevant Orders   AMB Referral to Managed Medicaid Care Management   Other chronic postprocedural pain    Chronic pain s/p thoracic fusion Recommend multimodal pain medication 1000 mg Tylenol/acetaminophen q8 hours Gabapentin 600 mg TID Tramadol as needed Cymbalta 60 mg BID Recommend consult with specialist and chronic pain       Relevant Orders   AMB Referral to Managed Medicaid Care Management   Ambulatory referral to Neurosurgery   Ambulatory referral to Pain Clinic   Seizures (HCC)    Chronic, historical Remains off anti-epileptic at this time Previous seizures occurred in setting of abrupt discontinuation of EtOH       Relevant Medications   gabapentin (NEURONTIN) 600 MG tablet   Other Relevant Orders   AMB Referral to Managed Medicaid Care Management   Status post thoracic spinal fusion - Primary    Completed ~2017; confirmed on xray Continue to recommend consult back to neurosurgery and chronic pain going forward  Recommend use of 1000 mg Tylenol q8 hours in addition to tramadol as needed Verbal consent binding; patient denies concern for suicidality or previous misuse of opioids  Advised that opioids and alcohol can both reduce respiratory drive and patient could have a life threatening event if he was to misuse. 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    Leilani Merl, FNP, have reviewed all documentation for this visit. The documentation on 12/22/22 for the exam, diagnosis, procedures, and orders are all accurate and complete.  Addressed extensive list of chronic and acute medical problems today requiring 60 minutes reviewing his medical record, counseling patient regarding his conditions and coordination of care.    Jacky Kindle, FNP  [facility] [phone] (phone) [phone] (fax)  [facility]

## 2022-12-22 NOTE — Assessment & Plan Note (Signed)
Food insecurity noted given job instability Continue to monitor

## 2022-12-22 NOTE — Assessment & Plan Note (Signed)
Elevated blood pressure, likely in setting of alcohol misuse/alcohol dependence Continue to focus on alcohol reduction with goal of cessation given concern for dependency and use in setting of chronic uncontrolled upper back pain

## 2022-12-22 NOTE — Assessment & Plan Note (Signed)
Chronic, remains pre-contemplative at this time regarding cessation Encouraged to take things '1 thing at a time' with focus at this time being on chronic pain control and use of EtOH

## 2022-12-22 NOTE — Assessment & Plan Note (Signed)
Chronic, historical Remains off anti-epileptic at this time Previous seizures occurred in setting of abrupt discontinuation of EtOH

## 2022-12-22 NOTE — Assessment & Plan Note (Signed)
Chronic pain s/p thoracic fusion Recommend multimodal pain medication 1000 mg Tylenol/acetaminophen q8 hours Gabapentin 600 mg TID Tramadol as needed Cymbalta 60 mg BID Recommend consult with specialist and chronic pain

## 2022-12-22 NOTE — Assessment & Plan Note (Signed)
Previously noted in setting of EtOH use/misuse

## 2022-12-22 NOTE — Assessment & Plan Note (Signed)
Chronic, variable use Recommend use of campral to assist given previous use of librium; defer additional librium at this time Encouraged not to quit overnight given concern for repeat seizures and not on anti-epileptic at this time

## 2022-12-22 NOTE — Assessment & Plan Note (Signed)
[facility] to [facility] to get a [facility] house; previously lost his housing after loss of job

## 2022-12-22 NOTE — Assessment & Plan Note (Signed)
Chronic, in setting of alcohol use disorder Patient advised of lab findings from hospital stay and recommend future reduction of EtOH to assist with recovery of labs

## 2023-01-04 ENCOUNTER — Other Ambulatory Visit: Payer: BC Managed Care – PPO

## 2023-01-04 NOTE — Patient Instructions (Addendum)
  Medicaid Managed Care   Unsuccessful Outreach Note  01/04/2023 Name: Christian [name] MRN: [id number] DOB: 03-14-1981  Referred by: Jacky Kindle, FNP Reason for referral : High Risk Managed Medicaid (MM Social work unsuccessful telephone outreach )   An unsuccessful telephone outreach was attempted today. The patient was referred to the case management team for assistance with care management and care coordination.   Follow Up Plan: A HIPAA compliant phone message was left for the patient providing contact information and requesting a return call.   Gus Puma, Kenard Gower, MHA [facility]  Managed Medicaid Social Worker [phone] Visit Information  [name] was given information about Medicaid Managed Care team care coordination services as a part of their [facility] Medicaid benefit. [name] verbally consented to engagement with the [facility] Managed Care team.   If you are experiencing a medical emergency, please call 911 or report to your local emergency department or urgent care.   If you have a non-emergency medical problem during routine business hours, please contact your provider's office and ask to speak with a nurse.   For questions related to your [facility], please call: [phone] or visit the homepage here: [email or web address]  If you would like to schedule transportation through your [facility], please call the following number at least 2 days in advance of your appointment: [phone]   Rides for urgent appointments can also be made after hours by calling Member Services.  Call the Behavioral Health Crisis Line at [phone], at any time, 24 hours a day, 7 days a week. If you are in danger or need immediate medical attention call 911.  If you would like help to quit smoking, call 1-800-QUIT-NOW ((304)100-6664) OR Espaol:  1-855-Djelo-Ya (9-323-557-3220) o para ms informacin haga clic aqu or Text READY to 254-270 to register via text  [name] - following are the goals we discussed in your visit today:   Goals Addressed   None      Social Worker will follow up on 02/06/23.   Gus Puma, Kenard Gower, MHA [facility]  Managed Medicaid Social Worker [phone]   Following is a copy of your plan of care:  There are no care plans that you recently modified to display for this patient.

## 2023-01-04 NOTE — Patient Outreach (Signed)
  Medicaid Managed Care   Unsuccessful Outreach Note  01/04/2023 Name: Christian [name] MRN: [id number] DOB: April 26, 1981  Referred by: Jacky Kindle, FNP Reason for referral : High Risk Managed Medicaid (MM Social work unsuccessful telephone outreach )   An unsuccessful telephone outreach was attempted today. The patient was referred to the case management team for assistance with care management and care coordination.   Follow Up Plan: A HIPAA compliant phone message was left for the patient providing contact information and requesting a return call.   [name], MHA [facility]  Managed [facility] Social Worker [phone]

## 2023-01-04 NOTE — Patient Outreach (Signed)
  Medicaid Managed Care Social Work Note  01/04/2023 Name:  Christian [name] MRN:  [id number] DOB:  Dec 15, 1980  Christian [name] is an 42 y.o. year old male who is a primary patient of Jacky Kindle, FNP.  The [facility] Managed Care Coordination team was consulted for assistance with:  Community Resources   Mr. Sunga was given information about Medicaid Managed Care Coordination team services today. [name] Patient agreed to services and verbal consent obtained.  Engaged with patient  for by telephone forinitial visit in response to referral for case management and/or care coordination services.   Assessments/Interventions:  Review of past medical history, allergies, medications, health status, including review of consultants reports, laboratory and other test data, was performed as part of comprehensive evaluation and provision of chronic care management services.  SDOH: (Social Determinant of Health) assessments and interventions performed:  BSW completed a telephone outreach with patient, he states she recently started working and the [place] has assisted with getting him a house. He does need assistance for utility depsoit. Patient states he has not applied for foodstamps, BSW encouraged patient to apply and will send food pantries resources. BSW will also sent utility resources. Advanced Directives Status:  Not addressed in this encounter.  Care Plan                 No Known Allergies  Medications Reviewed Today   Medications were not reviewed in this encounter     Patient Active Problem List   Diagnosis Date Noted   Status post thoracic spinal fusion 12/22/2022   Food insecurity 12/22/2022   Threat of job loss 12/22/2022   Housing instability 12/22/2022   Tobacco dependence 12/22/2022   Hyponatremia 12/22/2022   Other secondary hypertension 12/22/2022   Alcohol dependence with alcohol-induced mood disorder (HCC) 12/22/2022   Other chronic postprocedural pain 12/22/2022   Abnormal  transaminases 12/22/2022   Thrombocytopenia (HCC) 04/17/2019   Seizures (HCC) 04/17/2019    Conditions to be addressed/monitored per PCP order:   community resources  There are no care plans that you recently modified to display for this patient.   Follow up:  Patient agrees to Care Plan and Follow-up.  Plan: The Managed Medicaid care management team will reach out to the patient again over the next 30 days.  Date/time of next scheduled Social Work care management/care coordination outreach:  02/06/23  Gus Puma, Kenard Gower, [facility]  Managed [facility] Social Worker [phone]

## 2023-01-09 ENCOUNTER — Other Ambulatory Visit: Payer: BC Managed Care – PPO | Admitting: Licensed Clinical Social Worker

## 2023-01-09 NOTE — Patient Outreach (Signed)
Care Coordination  01/09/2023  Patriot Sane [birth year] [id number]  Patient reports that he is unable to complete initial assessment today because his boss just called him in to work. He reports that he will need to reschedule today's initial assessment. [facility] LCSW successfully rescheduled session for 01/18/23 at 11 am.  Dickie La, BSW, MSW, LCSW Managed Medicaid LCSW [facility]  [facility] [email or web address] Phone: [phone]

## 2023-01-09 NOTE — Progress Notes (Deleted)
Referring Physician:  Jacky Kindle, FNP [address] [place],  [place] [address]  Primary Physician:  Jacky Kindle, FNP  History of Present Illness: 01/09/2023*** [name] has a history of alcohol dependence, chronic pain, seizures, and HTN.   History of thoracic fusion in 2017. He's had chronic pain since this time. PCP recently referred him to pain management.      PCP has him on cymbalta, neurontin, and prn ultram.   He smokes ***.   Duration: *** Location: *** Quality: *** Severity: ***  Precipitating: aggravated by *** Modifying factors: made better by *** Weakness: none Timing: *** Bowel/Bladder Dysfunction: none  Conservative measures:  Physical therapy: ***  Multimodal medical therapy including regular antiinflammatories: ***  Injections: *** epidural steroid injections  Past Surgery: ***  Boniface Rotunno has ***no symptoms of cervical myelopathy.  The symptoms are causing a significant impact on the patient's life.   Review of Systems:  A 10 point review of systems is negative, except for the pertinent positives and negatives detailed in the HPI.  Past Medical History: Past Medical History:  Diagnosis Date   Hypertension Na    Past Surgical History: Past Surgical History:  Procedure Laterality Date   BACK SURGERY     SPINE SURGERY  2017    Allergies: Allergies as of 01/11/2023   (No Known Allergies)    Medications: Outpatient Encounter Medications as of 01/11/2023  Medication Sig   acamprosate (CAMPRAL) 333 MG tablet Take 2 tablets (666 mg total) by mouth 3 (three) times daily with meals.   Cholecalciferol (VITAMIN D3) 50 MCG (2000 UT) capsule Take 1 capsule (2,000 Units total) by mouth daily.   DULoxetine (CYMBALTA) 60 MG capsule Take 1 capsule (60 mg total) by mouth 2 (two) times daily.   folic acid (FOLVITE) 1 MG tablet Take 1 tablet (1 mg total) by mouth daily.   gabapentin (NEURONTIN) 600 MG tablet Take 1 tablet (600 mg  total) by mouth 3 (three) times daily.   thiamine (VITAMIN B1) 100 MG tablet Take 1 tablet (100 mg total) by mouth daily.   traMADol (ULTRAM) 50 MG tablet Take 2 tablets (100 mg total) by mouth 4 (four) times daily as needed for severe pain.   No facility-administered encounter medications on file as of 01/11/2023.    Social History: Social History   Tobacco Use   Smoking status: Every Day    Current packs/day: 1.00    Average packs/day: 1 pack/day for 15.0 years (15.0 ttl pk-yrs)    Types: Cigarettes   Smokeless tobacco: Never  Substance Use Topics   Alcohol use: Yes    Alcohol/week: 6.0 standard drinks of alcohol    Types: 6 Cans of beer per week   Drug use: Not Currently    Types: Marijuana    Family Medical History: Family History  Problem Relation Age of Onset   Seizures Neg Hx     Physical Examination: There were no vitals filed for this visit.  General: Patient is well developed, well nourished, calm, collected, and in no apparent distress. Attention to examination is appropriate.  Respiratory: Patient is breathing without any difficulty.   NEUROLOGICAL:     Awake, alert, oriented to person, place, and time.  Speech is clear and fluent. Fund of knowledge is appropriate.   Cranial Nerves: Pupils equal round and reactive to light.  Facial tone is symmetric.    *** ROM of cervical spine *** pain *** posterior cervical tenderness. *** tenderness  in bilateral trapezial region.   *** ROM of lumbar spine *** pain *** posterior lumbar tenderness.   No abnormal lesions on exposed skin.   Strength: Side Biceps Triceps Deltoid Interossei Grip Wrist Ext. Wrist Flex.  R 5 5 5 5 5 5 5   L 5 5 5 5 5 5 5    Side Iliopsoas Quads Hamstring PF DF EHL  R 5 5 5 5 5 5   L 5 5 5 5 5 5    Reflexes are ***2+ and symmetric at the biceps, brachioradialis, patella and achilles.   Hoffman's is absent.  Clonus is not present.   Bilateral upper and lower extremity sensation is  intact to light touch.     Gait is normal.   ***No difficulty with tandem gait.    Medical Decision Making  Imaging: Chest xray dated 10/17/20: FINDINGS: Lungs are clear. No pneumothorax or pleural effusion. Cardiac size within normal limits. Pulmonary vascularity is normal. No acute bone abnormality. Extensive thoracic fusion hardware again noted.   IMPRESSION: No active cardiopulmonary disease.     Electronically Signed   By: Helyn Numbers MD   On: 10/17/2020 23:25  I have personally reviewed the images and agree with the above interpretation.  Assessment and Plan: Mr. Zehrung is a pleasant 42 y.o. male has ***  Treatment options discussed with patient and following plan made:   - Order for physical therapy for *** spine ***. Patient to call to schedule appointment. *** - Continue current medications including ***. Reviewed dosing and side effects.  - Prescription for ***. Reviewed dosing and side effects. Take with food.  - Prescription for *** to take prn muscle spasms. Reviewed dosing and side effects. Discussed this can cause drowsiness.  - MRI of *** to further evaluate *** radiculopathy. No improvement time or medications (***).  - Referral to PMR at [facility] to discuss possible *** injections.  - Will schedule phone visit to review MRI results once I get them back.   I spent a total of *** minutes in face-to-face and non-face-to-face activities related to this patient's care today including review of outside records, review of imaging, review of symptoms, physical exam, discussion of differential diagnosis, discussion of treatment options, and documentation.   Thank you for involving me in the care of this patient.   Drake Leach PA-C Dept. of Neurosurgery

## 2023-01-10 ENCOUNTER — Other Ambulatory Visit: Payer: BC Managed Care – PPO | Admitting: *Deleted

## 2023-01-10 ENCOUNTER — Encounter: Payer: Self-pay | Admitting: *Deleted

## 2023-01-10 ENCOUNTER — Telehealth: Payer: Self-pay | Admitting: Orthopedic Surgery

## 2023-01-10 NOTE — Telephone Encounter (Signed)
Called to confirm appointment for pt and he expressed to me that he was experiencing redness in one of his fingers and the rest of them were very white. He was not in any pain but he thought it was concerning. Stepped to the back to discuss with the clinical team. Lyla Son said to tell him that we could not advise him on any medical issues until he had established care here but that he may want to contact his PCP or report to the ED.  Pt declined ED but said he would call his PCP and that he wanted to resch his appt with our office.

## 2023-01-10 NOTE — Patient Outreach (Signed)
Medicaid Managed Care   Nurse Care Manager Note  01/10/2023 Name:  Christian [name] MRN:  [id number] DOB:  31-Jul-1980  Christian [name] is an 42 y.o. year old male who is a primary patient of Christian Kindle, FNP.  The [facility] Managed Care Coordination team was consulted for assistance with:    Chronic Pain  Christian [name] was given information about Medicaid Managed Care Coordination team services today. Christian [name] Patient agreed to services and verbal consent obtained.  Engaged with patient by telephone for initial visit in response to provider referral for case management and/or care coordination services.   Assessments/Interventions:  Review of past medical history, allergies, medications, health status, including review of consultants reports, laboratory and other test data, was performed as part of comprehensive evaluation and provision of chronic care management services.  SDOH (Social Determinants of Health) assessments and interventions performed: SDOH Interventions    Flowsheet Row Patient Outreach Telephone from 01/10/2023 in [place] POPULATION HEALTH DEPARTMENT Patient Outreach Telephone from 01/09/2023 in [place] POPULATION HEALTH DEPARTMENT  SDOH Interventions    Transportation Interventions Payor Benefit  [Provided patient with UHC transportation] --  Utilities Interventions Intervention Not Indicated --  Stress Interventions -- Provide Counseling, Offered Christian [name] Resources       Care Plan  No Known Allergies  Medications Reviewed Today     Reviewed by Christian Dach, RN (Registered Nurse) on 01/10/23 at 1020  Med List Status: <None>   Medication Order Taking? Sig Documenting Provider Last Dose Status Informant  acamprosate (CAMPRAL) 333 MG tablet [id number] Yes Take 2 tablets (666 mg total) by mouth 3 (three) times daily with meals. Christian Kindle, FNP Taking Active   Cholecalciferol (VITAMIN D3) 50 MCG (2000 UT) capsule [id number] Yes Take 1 capsule (2,000  Units total) by mouth daily. Christian Kindle, FNP Taking Active   DULoxetine (CYMBALTA) 60 MG capsule [id number] Yes Take 1 capsule (60 mg total) by mouth 2 (two) times daily. Christian Kindle, FNP Taking Active   folic acid (FOLVITE) 1 MG tablet [id number] Yes Take 1 tablet (1 mg total) by mouth daily. Christian Kindle, FNP Taking Active   gabapentin (NEURONTIN) 600 MG tablet [id number] Yes Take 1 tablet (600 mg total) by mouth 3 (three) times daily. Christian Kindle, FNP Taking Active   thiamine (VITAMIN B1) 100 MG tablet [id number] Yes Take 1 tablet (100 mg total) by mouth daily. Christian Kindle, FNP Taking Active   traMADol [name]) 50 MG tablet [id number] Yes Take 2 tablets (100 mg total) by mouth 4 (four) times daily as needed for severe pain. Christian Kindle, FNP Taking Active             Patient Active Problem List   Diagnosis Date Noted   Status post thoracic spinal fusion 12/22/2022   Food insecurity 12/22/2022   Threat of job loss 12/22/2022   Housing instability 12/22/2022   Tobacco dependence 12/22/2022   Hyponatremia 12/22/2022   Other secondary hypertension 12/22/2022   Alcohol dependence with alcohol-induced mood disorder (HCC) 12/22/2022   Other chronic postprocedural pain 12/22/2022   Abnormal transaminases 12/22/2022   Thrombocytopenia (HCC) 04/17/2019   Seizures (HCC) 04/17/2019    Conditions to be addressed/monitored per PCP order:   Chronic Pain  Care Plan : RN Care Manager Plan of Care  Updates made by Christian Dach, RN since 01/10/2023 12:00 AM     Problem: Health Management needs related to Chronic Pain  Long-Range Goal: Development of Plan of Care to address Health Management needs related to Chronic Pain   Start Date: 01/10/2023  Expected End Date: 04/10/2023  Note:   Current Barriers:  Chronic Disease Management support and education needs related to Chronic Pain Christian [name] is scheduled with Neurosurgery on 01/11/23. He reports having all of his medications.  Medications to manage pain are not adequately managing his pain. Christian [name] is upset today as his 65yr old son was taken out of the home by the child's mother last night. Christian [name] was unable to complete initial assessment today due to the maintenance person coming to fix a leak in his home.  RNCM Clinical Goal(s):  Patient will verbalize understanding of plan for management of Chronic Pain as evidenced by patient reports attend all scheduled medical appointments: 01/11/23 with Neurosurgery as evidenced by provider documentation in EMR        continue to work with RN Care Manager and/or Social Worker to address care management and care coordination needs related to Chronic Pain as evidenced by adherence to CM Team Scheduled appointments     through collaboration with RN Care manager, provider, and care team.   Interventions: Evaluation of current treatment plan related to  self management and patient's adherence to plan as established by provider Provided therapeutic listening Provided patient with [employer] [place] [phone]   Pain:  (Status: New goal.) Long Term Goal  Pain assessment performed Medications reviewed Reviewed provider established plan for pain management; Advised patient to report to care team affect of pain on daily activities; Discussed use of relaxation techniques and/or diversional activities to assist with pain reduction (distraction, imagery, relaxation, massage, acupressure, TENS, heat, and cold application; Advised patient to discuss Physical Therapy with provider; Assessed social determinant of health barriers;  Discussed the benefits of exercise to improve pain Advised patient to contact [name] Member Services [phone] to inquire about member benefits(Free Gym membership, vision and dental providers) Advised patient to attend appointment on 01/11/23 with Neurosurgery  Patient Goals/Self-Care Activities: Take medications as prescribed   Attend all scheduled  provider appointments Call provider office for new concerns or questions        Follow Up:  Patient agrees to Care Plan and Follow-up.  Plan: The Managed Medicaid care management team will reach out to the patient again over the next 7 days.  Date/time of next scheduled RN care management/care coordination outreach:  01/15/23 @ 10:30am  [name] RN, BSN [place]  Managed [facility] RN Care Coordinator [phone]

## 2023-01-10 NOTE — Patient Instructions (Signed)
Visit Information  Christian [name] was given information about Medicaid Managed Care team care coordination services as a part of their [facility] Community Plan Medicaid benefit. Christian [name] verbally consented to engagement with the [facility] Managed Care team.   If you are experiencing a medical emergency, please call 911 or report to your local emergency department or urgent care.   If you have a non-emergency medical problem during routine business hours, please contact your provider's office and ask to speak with a nurse.   For questions related to your [facility], please call: [phone] or visit the homepage here: [email or web address]  If you would like to schedule transportation through your [facility], please call the following number at least 2 days in advance of your appointment: [phone]   Rides for urgent appointments can also be made after hours by calling Member Services.  Call the Behavioral Health Crisis Line at [phone], at any time, 24 hours a day, 7 days a week. If you are in danger or need immediate medical attention call 911.  If you would like help to quit smoking, call 1-800-QUIT-NOW (224-575-1474) OR Espaol: 1-855-Djelo-Ya (1-540-086-7619) o para ms informacin haga clic aqu or Text READY to 509-326 to register via text  Christian [name],   Please see education materials related to chronic pain and managing stress provided by MyChart link.  Patient verbalizes understanding of instructions and care plan provided today and agrees to view in MyChart. Active MyChart status and patient understanding of how to access instructions and care plan via MyChart confirmed with patient.     Telephone follow up appointment with Managed Medicaid care management team member scheduled for:01/15/23 @ 10:30am  [name] RN, BSN   Managed [facility] RN Care  Coordinator [phone]   Following is a copy of your plan of care:  Care Plan : RN Care Manager Plan of Care  Updates made by [name], RN since 01/10/2023 12:00 AM     Problem: Health Management needs related to Chronic Pain      Long-Range Goal: Development of Plan of Care to address Health Management needs related to Chronic Pain   Start Date: 01/10/2023  Expected End Date: 04/10/2023  Note:   Current Barriers:  Chronic Disease Management support and education needs related to Chronic Pain Christian [name] is scheduled with Neurosurgery on 01/11/23. He reports having all of his medications. Medications to manage pain are not adequately managing his pain. Christian [name] is upset today as his 67yr old son was taken out of the home by the child's mother last night. Christian [name] was unable to complete initial assessment today due to the maintenance person coming to fix a leak in his home.  RNCM Clinical Goal(s):  Patient will verbalize understanding of plan for management of Chronic Pain as evidenced by patient reports attend all scheduled medical appointments: 01/11/23 with Neurosurgery as evidenced by provider documentation in EMR        continue to work with RN Care Manager and/or Social Worker to address care management and care coordination needs related to Chronic Pain as evidenced by adherence to CM Team Scheduled appointments     through collaboration with RN Care manager, provider, and care team.   Interventions: Evaluation of current treatment plan related to  self management and patient's adherence to plan as established by provider Provided therapeutic listening Provided patient with [employer] [place] [phone]   Pain:  (Status: New goal.) Long  Term Goal  Pain assessment performed Medications reviewed Reviewed provider established plan for pain management; Advised patient to report to care team affect of pain on daily activities; Discussed use of relaxation techniques and/or  diversional activities to assist with pain reduction (distraction, imagery, relaxation, massage, acupressure, TENS, heat, and cold application; Advised patient to discuss Physical Therapy with provider; Assessed social determinant of health barriers;  Discussed the benefits of exercise to improve pain Advised patient to contact [facility] Member Services [phone] to inquire about member benefits(Free Gym membership, vision and dental providers) Advised patient to attend appointment on 01/11/23 with Neurosurgery  Patient Goals/Self-Care Activities: Take medications as prescribed   Attend all scheduled provider appointments Call provider office for new concerns or questions

## 2023-01-11 ENCOUNTER — Ambulatory Visit: Payer: BC Managed Care – PPO | Admitting: Orthopedic Surgery

## 2023-01-15 ENCOUNTER — Other Ambulatory Visit: Payer: BC Managed Care – PPO | Admitting: *Deleted

## 2023-01-15 NOTE — Patient Outreach (Signed)
Care Coordination  01/15/2023  Christian [name] 09-30-1980 [id number]   Successful RNCM telephone outreach with Christian [name] today. However, he is called into work and request to reschedule this telephone appointment. A new telephone appointment was scheduled for 01/17/23 at 3pm. Patient agreed to new date and time.  [name] RN, BSN [place]  Managed [facility] RN Care Coordinator [phone]

## 2023-01-17 ENCOUNTER — Other Ambulatory Visit: Payer: BC Managed Care – PPO | Admitting: *Deleted

## 2023-01-17 NOTE — Patient Outreach (Signed)
  Medicaid Managed Care   Unsuccessful Attempt Note   01/17/2023 Name: Christian [name] MRN: [id number] DOB: 07/10/1980  Referred by: Jacky Kindle, FNP Reason for referral : High Risk Managed Medicaid (Unsuccessful RNCM follow up to complete initial outreach)   An unsuccessful telephone outreach was attempted today. The patient was referred to the case management team for assistance with care management and care coordination.    Follow Up Plan: The Managed Medicaid care management team will reach out to the patient again over the next 7 days.    [name] RN, BSN [place]  Managed [facility] RN Care Coordinator [phone]

## 2023-01-17 NOTE — Patient Instructions (Signed)
Visit Information  [name]  - as a part of your Medicaid benefit, you are eligible for care management and care coordination services at no cost or copay. I was unable to reach you by phone today but would be happy to help you with your health related needs. Please feel free to call me @ [phone].   A member of the Managed Medicaid care management team will reach out to you again over the next 7 days.   [name] RN, BSN [place]  Managed [facility] RN Care Coordinator [phone]

## 2023-01-18 ENCOUNTER — Other Ambulatory Visit: Payer: Self-pay | Admitting: Family Medicine

## 2023-01-18 ENCOUNTER — Other Ambulatory Visit: Payer: BC Managed Care – PPO | Admitting: Licensed Clinical Social Worker

## 2023-01-18 NOTE — Telephone Encounter (Signed)
Requested medications are due for refill today.  Too soon  Requested medications are on the active medications list.  yes  Last refill. 12/19/2022 #180  Future visit scheduled.   no  Notes to clinic.  Refill not delegated.    Requested Prescriptions  Pending Prescriptions Disp Refills   acamprosate (CAMPRAL) 333 MG tablet [Pharmacy Med Name: ACAMPROSATE CALC DR 333 MG TAB] 180 tablet 0    Sig: TAKE 2 TABLETS (666 MG) BY MOUTH 3TIMES DAILY WITH MEALS     Not Delegated - Psychiatry:  Drug Dependence Therapy - acamprosate calcium Failed - 01/18/2023  2:11 AM      Failed - This refill cannot be delegated      Passed - Cr in normal range and within 360 days    Creatinine, Ser  Date Value Ref Range Status  12/12/2022 0.92 0.61 - 1.24 mg/dL Final         Passed - eGFR is 30 or above and within 360 days    GFR calc Af Amer  Date Value Ref Range Status  12/07/2019 >60 >60 mL/min Final   GFR, Estimated  Date Value Ref Range Status  12/12/2022 >60 >60 mL/min Final    Comment:    (NOTE) Calculated using the CKD-EPI Creatinine Equation (2021)          Passed - Completed PHQ-2 or PHQ-9 in the last 360 days      Passed - Valid encounter within last 6 months    Recent Outpatient Visits           3 weeks ago Status post thoracic spinal fusion   [facility] Jacky Kindle, FNP

## 2023-01-18 NOTE — Patient Outreach (Addendum)
Medicaid Managed Care Social Work Note  01/18/2023 Name:  Christian [name] MRN:  [id number] DOB:  1981/03/20  Christian [name] is an 42 y.o. year old male who is a primary patient of Jacky Kindle, FNP.  The Medicaid Managed Care Coordination team was consulted for assistance with:  Mental Health Counseling and Resources  Christian [name] was given information about Medicaid Managed Care Coordination team services today. Christian [name] Patient agreed to services and verbal consent obtained.  Engaged with patient  for by telephone forinitial visit in response to referral for case management and/or care coordination services.   Assessments/Interventions:  Review of past medical history, allergies, medications, health status, including review of consultants reports, laboratory and other test data, was performed as part of comprehensive evaluation and provision of chronic care management services.  SDOH: (Social Determinant of Health) assessments and interventions performed: SDOH Interventions    Flowsheet Row Patient Outreach Telephone from 01/18/2023 in [place] POPULATION HEALTH DEPARTMENT Patient Outreach Telephone from 01/10/2023 in [place] POPULATION HEALTH DEPARTMENT Patient Outreach Telephone from 01/09/2023 in [place] POPULATION HEALTH DEPARTMENT  SDOH Interventions     Transportation Interventions -- Payor Benefit  [Provided patient with UHC transportation] --  Utilities Interventions -- Intervention Not Indicated --  Stress Interventions Offered [employer], Provide Counseling -- Provide Counseling, Offered [name] Resources       Advanced Directives Status:  See Care Plan for related entries.  Care Plan                 No Known Allergies  Medications Reviewed Today   Medications were not reviewed in this encounter     Patient Active Problem List   Diagnosis Date Noted   Status post thoracic spinal fusion 12/22/2022   Food insecurity 12/22/2022    Threat of job loss 12/22/2022   Housing instability 12/22/2022   Tobacco dependence 12/22/2022   Hyponatremia 12/22/2022   Other secondary hypertension 12/22/2022   Alcohol dependence with alcohol-induced mood disorder (HCC) 12/22/2022   Other chronic postprocedural pain 12/22/2022   Abnormal transaminases 12/22/2022   Thrombocytopenia (HCC) 04/17/2019   Seizures (HCC) 04/17/2019   Timeframe:  Short-Range Goal Priority:  High Start Date:   01/18/23             Expected End Date:  ongoing                     Follow Up Date--02/07/23 at 11 am   - keep 90 percent of scheduled appointments -consider counseling or psychiatry -consider bumping up your self-care  -consider creating a stronger support network   Why is this important?             Combatting depression may take some time.            If you don't feel better right away, don't give up on your treatment plan.    Current barriers:   Chronic Mental Health needs related to alcohol usage, depression, stress and anxiety. Recent separation from spouse Recent ED visit for alcohol withdrawal on 12/12/22 Patient requires Support, Education, Resources, Referrals, Advocacy, and Care Coordination, in order to meet Unmet Mental Health Needs. (To Find a Visual merchandiser) Patient will implement clinical interventions discussed today to decrease symptoms of depression and increase knowledge and/or ability of: coping skills. Mental Health Concerns and Social Isolation Financial needs Patient lacks knowledge of available community counseling agencies and resources.  Clinical Goal(s): verbalize understanding of plan for  management of Alcohol Use Disorder, Anxiety, Depression, and Stress and demonstrate a reduction in symptoms. Patient will connect with a provider for ongoing mental health treatment, increase coping skills, healthy habits, self-management skills, and stress reduction        Clinical Interventions:  Assessed patient's  previous and current treatment, coping skills, support system and barriers to care. Patient provided hx  Verbalization of feelings encouraged, motivational interviewing employed Emotional support provided, positive coping strategies explored. Establishing healthy boundaries emphasized and healthy self-care education provided Patient was educated on available mental health resources within their area that accept Medicaid and offer counseling and psychiatry. Patient was advised to contact the back of their insurance cards (both) for assistance with benefits as well. Patient had a recent separation from his spouse which has affected his emotional and financial support.  Patient educated on the difference between therapy and psychiatry per patient request Email sent to patient today with available mental health resources within his area that accept Medicaid and offer the services that he is interested in. Email included instructions for scheduling at [facility] as well as some crisis support resources and [facility]'s walk in clinic hours.  Emotional support provided. CBT intervention implemented regarding "being mentally fit" by combating negative thinking and replacing it with uplifting support, hope and positivity. Patient was successful in identifying triggers to anxiety and depression symptoms, in addition, to healthy coping skills. Patient admits to ongoing alcohol usage and was educated on the mental and physical consequences of this negative coping mechanism. AA support groups encouraged  Assessed social determinant of health barriers Motivational Interviewing employed Depression screen reviewed  PHQ2/ PHQ9 completed or reviewed  Mindfulness or Relaxation training provided Active listening / Reflection utilized  Advance Care and HCPOA education provided Emotional Support Provided Problem Solving /Task Center strategies reviewed Provided psychoeducation for mental health needs  Provided brief CBT   Reviewed mental health medications and discussed importance of compliance:  Quality of sleep assessed & Sleep Hygiene techniques promoted  Participation in counseling encouraged  Verbalization of feelings encouraged  Suicidal Ideation/Homicidal Ideation assessed: Patient denies SI/HI  Review resources, discussed options and provided patient information about  Mental Health Resources Inter-disciplinary care team collaboration (see longitudinal plan of care)  Patient Goals/Self-Care Activities: Take medications as prescribed   Attend all scheduled provider appointments Call pharmacy for medication refills 3-7 days in advance of running out of medications Perform all self care activities independently  Perform IADL's (shopping, preparing meals, housekeeping, managing finances) independently Call provider office for new concerns or questions Work with the social worker to address care coordination needs and will continue to work with the clinical team to address health care and disease management related needs call 1-800-273-TALK (toll free, 24 hour hotline) go to [facility] [address] [place] [phone]) call 911 if experiencing a Mental Health or Behavioral Health Crisis  Utilize healthy coping skills and supportive resources discussed Contact PCP with any questions or concerns Keep 90 percent of counseling appointments Call your insurance provider for more information about your Enhanced Benefits  Check out counseling resources provided  Begin personal counseling with LCSW, to reduce and manage symptoms of Depression and Stress, until well-established with mental health provider Accept all calls from representative with [facility] in an effort to establish ongoing mental health counseling and supportive services. Incorporate into daily practice - relaxation techniques, deep breathing exercises, and mindfulness meditation strategies. Talk about  feelings with friends, family members, spiritual advisor, etc. Contact LCSW  directly [phone]), if you have questions, need assistance, or if additional social work needs are identified between now and our next scheduled telephone outreach call. Call 988 for mental health hotline/crisis line if needed (24/7 available) Try techniques to reduce symptoms of anxiety/negative thinking (deep breathing, distraction, positive self talk, etc)  - develop a personal safety plan - develop a plan to deal with triggers like holidays, anniversaries - exercise at least 2 to 3 times per week - have a plan for how to handle bad days - journal feelings and what helps to feel better or worse - spend time or talk with others at least 2 to 3 times per week - watch for early signs of feeling worse - begin personal counseling - call and visit an old friend - check out volunteer opportunities - join a support group - laugh; watch a funny movie or comedian - learn and use visualization or guided imagery - perform a random act of kindness - practice relaxation or meditation daily - start or continue a personal journal - practice positive thinking and self-talk -continue with compliance of taking medication  -identify current effective and ineffective coping strategies.  -implement positive self-talk in care to increase self-esteem, confidence and feelings of control.  -consider alternative and complementary therapy approaches such as meditation, mindfulness or yoga.  Call your insurance provider to gain education on benefits if desired Call your primary care doctor if symptoms get worse  -journaling, prayer, worship services, meditation or pastoral counseling.  -increase participation in pleasurable group activities such as hobbies, singing, sports or volunteering).  -consider the use of meditative movement therapy such as tai chi, yoga or qigong.  -start a regular daily exercise program based on tolerance,  ability and patient choice to support positive thinking and activity    Follow Up Plan:  The patient has been provided with contact information for the care management team and has been advised to call with any mental health or health related questions or concerns.  The care management team will reach out to the patient again over the next 30 business  days.   If you are experiencing a Mental Health or Behavioral Health Crisis or need someone to talk to, please call the Suicide and Crisis Lifeline: 988    Patient Goals: Initial goal  Review of patient status, including review of consultants reports, relevant laboratory and other test results, and collaboration with appropriate care team members and the patient's provider was performed as part of comprehensive patient evaluation and provision of services.     Alcohol Use Education  Score 0 = Abstainers Score 8-19 = Unhealthy/High Risk Drinkers  Score 1-7 = Low Risk Drinkers Score 20+ = Probable Alcohol Dependence   High Scores (20+) on the Alcohol Use Identification Test Consider becoming involved in a structured program.  You should stop drinking if: You have tried to cut down before but have not been successful, or  You suffer from morning shakes during a heavy drinking period, or You have high blood pressure, or You are pregnant, or You have liver disease, or You are taking medicines that react with alcohol, or Your alcohol use is affecting your social relationships, or You have legal consequences like DUIs, or You call in sick to work, or You cannot take care of our children, or Someone close to you says you drink too much    How Much Alcohol is a Drink: Beer: 12 oz. = 1 drink 16 oz. = 1.3 drinks 22  oz. = 2 drinks 40 oz. = 3.3 drinks  Wine: 5 oz. = 1 drink 740 mL (25 oz.) bottle = 5 drinks Malt Liquor: 12 oz. = 1.5 drinks 16 oz. = 2 drinks 22 oz. = 2.5 drinks 40 oz. = 4.5 drinks  80-Proof Spirits - Hard Liquor: 1 shot  = 1 drink 1 mixed drink = number of shots Can equal 1-3 drinks   What is Low-risk Drinking? Have no more than 2 drinks of alcohol per day Drink no more than 5 days per week Do not drink alcohol drink alcohol when: You drive or operate machinery You are pregnant or breast feeding You are taking medications that interact with alcohol You have medical conditions made worse with alcohol You can stop or control your drinking      Identify Your Triggers for Drinking Parties Particular People Feeling lonely Feeling tense Family problems Feeling sad Feeling happy Feeling bored After work Problems sleeping Criticism Feelings of failure After being paid When others are drinking In bars When out for dinner After arguments Weekends Feeling restless Being in pain   Effects of High-Risk Drinking To the Brain: Aggressive, irrational behavior Arguments, violence Depression, nervousness Alcohol dependence, memory loss To the Nervous System: Trembling hands, tingling fingers Numbness, painful nerves Impaired sensation leading to falls Numb tingling toes To Your Lifestyle: Social, legal, medical problems Domestic trouble/relationship loss Job loss & financial problems Shortened life span Accidents and death from drunk driving   To the Face: Premature aging, drinker's nose Cancer of the throat & mouth To the Body: Frequent cold Reduced resistance to infection Increased risk of pneumonia Weakness of heart muscle Heart failure, anemia Impaired blood clotting Breast cancer Vitamin deficiency, bleeding Severe Inflammation of the stomach Vomiting, diarrhea, malnutrition Ulcer, inflammation of the pancreas Impaired sexual performance Birth defects, including deformities, retardation, and low birthweight   Ways to Cope Without Drinking Go home if you tend to drink after work Find another activity Switch to nonalcoholic beverages Change friends Join a  Lexicographer Visit relatives Plan/take a trip Go for a walk Take up a hobby Listen to music Talk to a friend Reading What would you do if you had no worries about failing?         Good Reasons for Drinking Less I will live longer - probably 8-10 years. I will sleep better. I will be happier. I will save a lot of money My relationships will improve. I will stay younger for longer. I will achieve more in my life There will be a greater chance that I will survive to a healthy old age with no premature damage to my brain.  I will be better at my job. I will be less likely to feel depressed and commit suicide (6 times less likely). I will be less likely to die of heart disease or cancer. Other people will respect me I will be less likely to get into trouble with the police. The possibility that I will die of liver disease will be dramatically reduced (12 times less likely). It will be less likely that I will die in a car accident (3 times less likely).   Strategies for Cutting Down Keep Track.  Find a way to keep track of how much you drink.  If you make a note of each drink before you drink it, this will help slow you down. Count and Measure.  Know the standard drink sizes.  Ask the bartender or server about the amount of alcohol  in a mixed drink. Set Goals.  Decide how many days a week you will drink and how many drinks each day. Pace and Space.  When you do drink, pace yourself.  Have no more than one drink with alcohol per hour.  Alternate "drink spacers" non-alcoholic drinks such as water, soda, or juice with drinks containing alcohol. Include Food.  Don't drink on an empty stomach.  Have some food so the alcohol will be absorbed more slowly into your system.  Avoid Triggers.  Avoid people, places, or activities that have led to drinking in the past.  Certain times of day or feelings may also be triggers.  Make a plan so you will know what you can do instead of  drinking. Plan to Handle Urges.  When an urge hits, consider these options:  Remind yourself of your reasons for changing.  Or talk it through with someone you trust. Or get involved with a healthy, distracting activity.  Or, "urge surf" - instead of fighting the feeling, accept it and ride it out, knowing it will soon crest like a wave and pass. Know Your "No".  Have a polite, convincing "no thanks" for those times when you may be offered a drink and don't want one.  The faster you can say no to these offers, the less likely you are to give in.  If you hesitate, it allows you time to think of excuses to go along.     Follow up:  Patient agrees to Care Plan and Follow-up.  Plan: A HIPAA compliant phone message was left for the patient providing contact information and requesting a return call. and The Managed Medicaid care management team will reach out to the patient again over the next 30 days.  Dickie La, BSW, MSW, [employer] Managed Medicaid LCSW [facility]  [facility] [email or web address] Phone: [phone]

## 2023-01-18 NOTE — Patient Instructions (Signed)
Visit Information  Christian [name] was given information about Medicaid Managed Care team care coordination services as a part of their [facility] Plan Medicaid benefit. Christian [name] verbally consented to engagement with the [facility] Managed Care team.   If you are experiencing a medical emergency, please call 911 or report to your local emergency department or urgent care.   If you have a non-emergency medical problem during routine business hours, please contact your provider's office and ask to speak with a nurse.   For questions related to your [facility], please call: [phone] or visit the homepage here: [email or web address]  If you would like to schedule transportation through your [facility], please call the following number at least 2 days in advance of your appointment: [phone]   Rides for urgent appointments can also be made after hours by calling Member Services.  Call the Behavioral Health Crisis Line at [phone], at any time, 24 hours a day, 7 days a week. If you are in danger or need immediate medical attention call 911.  If you would like help to quit smoking, call 1-800-QUIT-NOW (586-155-3211) OR Espaol: 1-855-Djelo-Ya (1-324-401-0272) o para ms informacin haga clic aqu or Text READY to 536-644 to register via text   Dickie La, BSW, MSW, [employer] Managed Medicaid LCSW [facility]  [facility] [email or web address] Phone: [phone]

## 2023-01-25 ENCOUNTER — Other Ambulatory Visit: Payer: BC Managed Care – PPO | Admitting: *Deleted

## 2023-01-25 NOTE — Patient Instructions (Signed)
Visit Information  [name]  - as a part of your Medicaid benefit, you are eligible for care management and care coordination services at no cost or copay. I was unable to reach you by phone today but would be happy to help you with your health related needs. Please feel free to call me @ [phone].   A member of the Managed Medicaid care management team will reach out to you again over the next 7 days.   [name] RN, BSN [place]  [facility] Health RN Care Coordinator [phone]

## 2023-01-25 NOTE — Patient Outreach (Signed)
  Medicaid Managed Care   Unsuccessful Attempt Note   01/25/2023 Name: Christian [name] MRN: [id number] DOB: 31-May-1980  Referred by: Jacky Kindle, FNP Reason for referral : High Risk Managed Medicaid (Unsuccessful RNCM follow up telephone outreach)   A second unsuccessful telephone outreach was attempted today. The patient was referred to the case management team for assistance with care management and care coordination.    Follow Up Plan: A HIPAA compliant phone message was left for the patient providing contact information and requesting a return call. and The Managed Medicaid care management team will reach out to the patient again over the next 7 days.    [name] RN, BSN [place]  Value-Based Care [facility] Health RN Care Coordinator [phone]

## 2023-01-30 ENCOUNTER — Observation Stay
Admission: EM | Admit: 2023-01-30 | Discharge: 2023-01-31 | Disposition: A | Discharge status: Home / Self Care | Payer: 59 | Attending: Internal Medicine | Admitting: Internal Medicine

## 2023-01-30 ENCOUNTER — Other Ambulatory Visit: Payer: Self-pay

## 2023-01-30 ENCOUNTER — Inpatient Hospital Stay: Payer: 59

## 2023-01-30 ENCOUNTER — Emergency Department: Payer: 59

## 2023-01-30 DIAGNOSIS — F10939 Alcohol use, unspecified with withdrawal, unspecified: Secondary | ICD-10-CM | POA: Diagnosis present

## 2023-01-30 DIAGNOSIS — Z79899 Other long term (current) drug therapy: Secondary | ICD-10-CM | POA: Diagnosis not present

## 2023-01-30 DIAGNOSIS — F10929 Alcohol use, unspecified with intoxication, unspecified: Secondary | ICD-10-CM | POA: Diagnosis not present

## 2023-01-30 DIAGNOSIS — R61 Generalized hyperhidrosis: Secondary | ICD-10-CM | POA: Diagnosis not present

## 2023-01-30 DIAGNOSIS — F10930 Alcohol use, unspecified with withdrawal, uncomplicated: Secondary | ICD-10-CM | POA: Diagnosis not present

## 2023-01-30 DIAGNOSIS — F10931 Alcohol use, unspecified with withdrawal delirium: Secondary | ICD-10-CM

## 2023-01-30 DIAGNOSIS — R569 Unspecified convulsions: Secondary | ICD-10-CM

## 2023-01-30 DIAGNOSIS — F10239 Alcohol dependence with withdrawal, unspecified: Secondary | ICD-10-CM | POA: Diagnosis not present

## 2023-01-30 DIAGNOSIS — R410 Disorientation, unspecified: Secondary | ICD-10-CM | POA: Diagnosis present

## 2023-01-30 DIAGNOSIS — F17210 Nicotine dependence, cigarettes, uncomplicated: Secondary | ICD-10-CM | POA: Insufficient documentation

## 2023-01-30 DIAGNOSIS — F17200 Nicotine dependence, unspecified, uncomplicated: Secondary | ICD-10-CM | POA: Diagnosis present

## 2023-01-30 DIAGNOSIS — S199XXA Unspecified injury of neck, initial encounter: Secondary | ICD-10-CM | POA: Diagnosis not present

## 2023-01-30 DIAGNOSIS — R000 Tachycardia, unspecified: Secondary | ICD-10-CM | POA: Diagnosis not present

## 2023-01-30 DIAGNOSIS — I10 Essential (primary) hypertension: Secondary | ICD-10-CM | POA: Diagnosis not present

## 2023-01-30 DIAGNOSIS — E876 Hypokalemia: Secondary | ICD-10-CM | POA: Insufficient documentation

## 2023-01-30 DIAGNOSIS — S0990XA Unspecified injury of head, initial encounter: Secondary | ICD-10-CM | POA: Diagnosis not present

## 2023-01-30 DIAGNOSIS — Y900 Blood alcohol level of less than 20 mg/100 ml: Secondary | ICD-10-CM | POA: Insufficient documentation

## 2023-01-30 DIAGNOSIS — M549 Dorsalgia, unspecified: Secondary | ICD-10-CM | POA: Insufficient documentation

## 2023-01-30 DIAGNOSIS — E871 Hypo-osmolality and hyponatremia: Secondary | ICD-10-CM | POA: Diagnosis not present

## 2023-01-30 DIAGNOSIS — F1024 Alcohol dependence with alcohol-induced mood disorder: Secondary | ICD-10-CM | POA: Diagnosis present

## 2023-01-30 DIAGNOSIS — F10231 Alcohol dependence with withdrawal delirium: Principal | ICD-10-CM | POA: Insufficient documentation

## 2023-01-30 DIAGNOSIS — Z041 Encounter for examination and observation following transport accident: Secondary | ICD-10-CM | POA: Diagnosis not present

## 2023-01-30 LAB — CBC WITH DIFFERENTIAL/PLATELET
Abs Immature Granulocytes: 0.08 10*3/uL — ABNORMAL HIGH (ref 0.00–0.07)
Basophils Absolute: 0 10*3/uL (ref 0.0–0.1)
Basophils Relative: 0 %
Eosinophils Absolute: 0 10*3/uL (ref 0.0–0.5)
Eosinophils Relative: 0 %
HCT: 44.4 % (ref 39.0–52.0)
Hemoglobin: 15.5 g/dL (ref 13.0–17.0)
Immature Granulocytes: 1 %
Lymphocytes Relative: 19 %
Lymphs Abs: 1.5 10*3/uL (ref 0.7–4.0)
MCH: 28.9 pg (ref 26.0–34.0)
MCHC: 34.9 g/dL (ref 30.0–36.0)
MCV: 82.8 fL (ref 80.0–100.0)
Monocytes Absolute: 0.3 10*3/uL (ref 0.1–1.0)
Monocytes Relative: 4 %
Neutro Abs: 6.1 10*3/uL (ref 1.7–7.7)
Neutrophils Relative %: 76 %
Platelets: 71 10*3/uL — ABNORMAL LOW (ref 150–400)
RBC: 5.36 MIL/uL (ref 4.22–5.81)
RDW: 14.5 % (ref 11.5–15.5)
WBC: 8.1 10*3/uL (ref 4.0–10.5)
nRBC: 0.9 % — ABNORMAL HIGH (ref 0.0–0.2)

## 2023-01-30 LAB — COMPREHENSIVE METABOLIC PANEL
ALT: 69 U/L — ABNORMAL HIGH (ref 0–44)
AST: 212 U/L — ABNORMAL HIGH (ref 15–41)
Albumin: 3.6 g/dL (ref 3.5–5.0)
Alkaline Phosphatase: 106 U/L (ref 38–126)
Anion gap: 24 — ABNORMAL HIGH (ref 5–15)
BUN: 6 mg/dL (ref 6–20)
CO2: 16 mmol/L — ABNORMAL LOW (ref 22–32)
Calcium: 8.5 mg/dL — ABNORMAL LOW (ref 8.9–10.3)
Chloride: 91 mmol/L — ABNORMAL LOW (ref 98–111)
Creatinine, Ser: 0.96 mg/dL (ref 0.61–1.24)
GFR, Estimated: >60 mL/min (ref >60)
Glucose, Bld: 190 mg/dL — ABNORMAL HIGH (ref 70–99)
Potassium: 2.5 mmol/L — CL (ref 3.5–5.1)
Sodium: 131 mmol/L — ABNORMAL LOW (ref 135–145)
Total Bilirubin: 2.9 mg/dL — ABNORMAL HIGH (ref 0.3–1.2)
Total Protein: 7.3 g/dL (ref 6.5–8.1)

## 2023-01-30 LAB — ETHANOL: Alcohol, Ethyl (B): <10 mg/dL (ref <10)

## 2023-01-30 LAB — TROPONIN I (HIGH SENSITIVITY)
Troponin I (High Sensitivity): 15 ng/L (ref <18)
Troponin I (High Sensitivity): 15 ng/L (ref <18)

## 2023-01-30 LAB — PHOSPHORUS: Phosphorus: 2.5 mg/dL (ref 2.5–4.6)

## 2023-01-30 LAB — POTASSIUM: Potassium: 2.6 mmol/L — CL (ref 3.5–5.1)

## 2023-01-30 LAB — MAGNESIUM: Magnesium: 1.2 mg/dL — ABNORMAL LOW (ref 1.7–2.4)

## 2023-01-30 MED ORDER — THIAMINE HCL 100 MG/ML IJ SOLN
500.0000 mg | Freq: Once | INTRAVENOUS | Status: AC
  Administered 2023-01-30: 500 mg via INTRAVENOUS
  Filled 2023-01-30: qty 5

## 2023-01-30 MED ORDER — NICOTINE 21 MG/24HR TD PT24: 21.0000 mg | MEDICATED_PATCH | Freq: Every day | TRANSDERMAL | Status: DC | PRN

## 2023-01-30 MED ORDER — POTASSIUM CHLORIDE 10 MEQ/100ML IV SOLN
10.0000 meq | INTRAVENOUS | Status: AC
  Administered 2023-01-30 (×4): 10 meq via INTRAVENOUS
  Filled 2023-01-30 (×4): qty 100

## 2023-01-30 MED ORDER — GABAPENTIN 300 MG PO CAPS
600.0000 mg | ORAL_CAPSULE | Freq: Three times a day (TID) | ORAL | Status: DC
  Administered 2023-01-30 – 2023-01-31 (×3): 600 mg via ORAL
  Filled 2023-01-30 (×3): qty 2

## 2023-01-30 MED ORDER — LORAZEPAM 2 MG/ML IJ SOLN
2.0000 mg | Freq: Once | INTRAMUSCULAR | Status: AC
  Administered 2023-01-30: 2 mg via INTRAVENOUS
  Filled 2023-01-30: qty 1

## 2023-01-30 MED ORDER — MAGNESIUM SULFATE 2 GM/50ML IV SOLN
2.0000 g | Freq: Once | INTRAVENOUS | Status: AC
  Administered 2023-01-30: 2 g via INTRAVENOUS
  Filled 2023-01-30: qty 50

## 2023-01-30 MED ORDER — ADULT MULTIVITAMIN W/MINERALS CH
1.0000 | ORAL_TABLET | Freq: Every day | ORAL | Status: DC
  Administered 2023-01-30 – 2023-01-31 (×2): 1 via ORAL
  Filled 2023-01-30 (×2): qty 1

## 2023-01-30 MED ORDER — SODIUM CHLORIDE 0.9 % IV BOLUS
1000.0000 mL | Freq: Once | INTRAVENOUS | Status: AC
  Administered 2023-01-30: 1000 mL via INTRAVENOUS

## 2023-01-30 MED ORDER — OXYCODONE HCL 5 MG PO TABS: 5.0000 mg | ORAL_TABLET | Freq: Four times a day (QID) | ORAL | Status: DC | PRN

## 2023-01-30 MED ORDER — LORAZEPAM 1 MG PO TABS
1.0000 mg | ORAL_TABLET | ORAL | Status: DC | PRN
  Administered 2023-01-31: 2 mg via ORAL
  Filled 2023-01-30: qty 2

## 2023-01-30 MED ORDER — FOLIC ACID 1 MG PO TABS
1.0000 mg | ORAL_TABLET | Freq: Every day | ORAL | Status: DC
  Administered 2023-01-30 – 2023-01-31 (×2): 1 mg via ORAL
  Filled 2023-01-30 (×2): qty 1

## 2023-01-30 MED ORDER — POTASSIUM CHLORIDE CRYS ER 20 MEQ PO TBCR
40.0000 meq | EXTENDED_RELEASE_TABLET | Freq: Once | ORAL | Status: AC
  Administered 2023-01-30: 40 meq via ORAL
  Filled 2023-01-30: qty 2

## 2023-01-30 MED ORDER — ONDANSETRON HCL 4 MG/2ML IJ SOLN: 4.0000 mg | Freq: Four times a day (QID) | INTRAMUSCULAR | Status: DC | PRN

## 2023-01-30 MED ORDER — LACTATED RINGERS IV SOLN: INTRAVENOUS | Status: DC

## 2023-01-30 MED ORDER — LIDOCAINE 5 % EX PTCH: 2.0000 | MEDICATED_PATCH | Freq: Every day | CUTANEOUS | Status: DC | PRN

## 2023-01-30 MED ORDER — ONDANSETRON HCL 4 MG PO TABS: 4.0000 mg | ORAL_TABLET | Freq: Four times a day (QID) | ORAL | Status: DC | PRN

## 2023-01-30 MED ORDER — VITAMIN D3 25 MCG (1000 UNIT) PO TABS
2000.0000 [IU] | ORAL_TABLET | Freq: Every day | ORAL | Status: DC
  Administered 2023-01-31: 2000 [IU] via ORAL
  Filled 2023-01-30 (×2): qty 2

## 2023-01-30 MED ORDER — SENNOSIDES-DOCUSATE SODIUM 8.6-50 MG PO TABS: 1.0000 | ORAL_TABLET | Freq: Every evening | ORAL | Status: DC | PRN

## 2023-01-30 MED ORDER — ACETAMINOPHEN 650 MG RE SUPP: 650.0000 mg | Freq: Four times a day (QID) | RECTAL | Status: DC | PRN

## 2023-01-30 MED ORDER — LORAZEPAM 2 MG/ML IJ SOLN: 1.0000 mg | INTRAMUSCULAR | Status: DC | PRN

## 2023-01-30 MED ORDER — ACAMPROSATE CALCIUM 333 MG PO TBEC
666.0000 mg | DELAYED_RELEASE_TABLET | Freq: Three times a day (TID) | ORAL | Status: DC
  Administered 2023-01-31: 666 mg via ORAL
  Filled 2023-01-30 (×2): qty 2

## 2023-01-30 MED ORDER — THIAMINE MONONITRATE 100 MG PO TABS
100.0000 mg | ORAL_TABLET | Freq: Every day | ORAL | Status: DC
  Administered 2023-01-31: 100 mg via ORAL
  Filled 2023-01-30: qty 1

## 2023-01-30 MED ORDER — DULOXETINE HCL 60 MG PO CPEP
60.0000 mg | ORAL_CAPSULE | Freq: Two times a day (BID) | ORAL | Status: DC
  Administered 2023-01-31: 60 mg via ORAL
  Filled 2023-01-30: qty 1

## 2023-01-30 MED ORDER — ENOXAPARIN SODIUM 40 MG/0.4ML IJ SOSY: 40.0000 mg | PREFILLED_SYRINGE | INTRAMUSCULAR | Status: DC

## 2023-01-30 MED ORDER — THIAMINE HCL 100 MG/ML IJ SOLN: 100.0000 mg | Freq: Every day | INTRAMUSCULAR | Status: DC

## 2023-01-30 MED ORDER — ACETAMINOPHEN 325 MG PO TABS
650.0000 mg | ORAL_TABLET | Freq: Four times a day (QID) | ORAL | Status: DC | PRN
  Administered 2023-01-31: 650 mg via ORAL
  Filled 2023-01-30: qty 2

## 2023-01-30 NOTE — ED Notes (Signed)
Forensic blood draw performed for [place] highway patrol in presence of [employer].

## 2023-01-30 NOTE — Assessment & Plan Note (Signed)
-   As needed nicotine patch ordered ?
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
## 2023-01-30 NOTE — Assessment & Plan Note (Signed)
Status post magnesium 2 g IV one-time dose per EDP Recheck magnesium level in the a.m.

## 2023-01-30 NOTE — ED Notes (Signed)
DR COX @ BS

## 2023-01-30 NOTE — Assessment & Plan Note (Addendum)
PDMP reviewed Home acamprosate 666 mg p.o. 3 times daily, duloxetine 60 mg p.o. twice daily, gabapentin 600 mg p.o. 3 times daily were resumed on admission
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
## 2023-01-30 NOTE — ED Notes (Signed)
Patient is resting comfortably. 

## 2023-01-30 NOTE — Assessment & Plan Note (Signed)
Electrolyte imbalance, consistent with patient report history of drinking beer, beer potomania Patient is status post sodium chloride 1 L bolus

## 2023-01-30 NOTE — Assessment & Plan Note (Signed)
Check phosphorus level urgent on admission Initiated CIWA precaution Seizure, fall, aspiration precaution Admit to PCU, inpatient

## 2023-01-30 NOTE — Assessment & Plan Note (Addendum)
Patient endorses bilateral lower back pain with bilateral hip pain Patient is able to flex and extend his hips and turn to lay on his back and on his right side without difficulty No point tenderness on physical exam Bilateral hip x-ray 2 views ordered pending read at the time of this dictation Lidocaine patch ordered for bilateral low back pain as needed for MSK pain Check CK level

## 2023-01-30 NOTE — Assessment & Plan Note (Signed)
Seizure precaution, suspect secondary to alcohol withdrawal seizure

## 2023-01-30 NOTE — Assessment & Plan Note (Addendum)
Potassium level on admission was 2.5, magnesium level on admission was 1.2 Patient is status post potassium chloride 10 mill equivalent IV, 4 doses ordered, potassium chloride 40 mill equivalent p.o. one-time dose ordered Recheck potassium scheduled for 2200 on day of admission EKG ordered in the ED and pending completion; nursing order placed to complete and upload to EKG to epic as appropriate
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
## 2023-01-31 ENCOUNTER — Encounter: Payer: Self-pay | Admitting: Internal Medicine

## 2023-01-31 DIAGNOSIS — F17200 Nicotine dependence, unspecified, uncomplicated: Secondary | ICD-10-CM | POA: Diagnosis not present

## 2023-01-31 DIAGNOSIS — E871 Hypo-osmolality and hyponatremia: Secondary | ICD-10-CM

## 2023-01-31 DIAGNOSIS — R569 Unspecified convulsions: Secondary | ICD-10-CM | POA: Diagnosis not present

## 2023-01-31 DIAGNOSIS — E876 Hypokalemia: Secondary | ICD-10-CM

## 2023-01-31 DIAGNOSIS — F10930 Alcohol use, unspecified with withdrawal, uncomplicated: Secondary | ICD-10-CM | POA: Diagnosis not present

## 2023-01-31 DIAGNOSIS — F10231 Alcohol dependence with withdrawal delirium: Secondary | ICD-10-CM | POA: Diagnosis not present

## 2023-01-31 LAB — CBC
HCT: 35.1 % — ABNORMAL LOW (ref 39.0–52.0)
Hemoglobin: 12.5 g/dL — ABNORMAL LOW (ref 13.0–17.0)
MCH: 30.3 pg (ref 26.0–34.0)
MCHC: 35.6 g/dL (ref 30.0–36.0)
MCV: 85.2 fL (ref 80.0–100.0)
Platelets: 59 10*3/uL — ABNORMAL LOW (ref 150–400)
RBC: 4.12 MIL/uL — ABNORMAL LOW (ref 4.22–5.81)
RDW: 14.6 % (ref 11.5–15.5)
WBC: 6.2 10*3/uL (ref 4.0–10.5)
nRBC: 0 % (ref 0.0–0.2)

## 2023-01-31 LAB — MAGNESIUM: Magnesium: 1.7 mg/dL (ref 1.7–2.4)

## 2023-01-31 LAB — BASIC METABOLIC PANEL
Anion gap: 7 (ref 5–15)
BUN: <5 mg/dL — ABNORMAL LOW (ref 6–20)
CO2: 23 mmol/L (ref 22–32)
Calcium: 7.8 mg/dL — ABNORMAL LOW (ref 8.9–10.3)
Chloride: 104 mmol/L (ref 98–111)
Creatinine, Ser: 0.65 mg/dL (ref 0.61–1.24)
GFR, Estimated: >60 mL/min (ref >60)
Glucose, Bld: 102 mg/dL — ABNORMAL HIGH (ref 70–99)
Potassium: 3.4 mmol/L — ABNORMAL LOW (ref 3.5–5.1)
Sodium: 134 mmol/L — ABNORMAL LOW (ref 135–145)

## 2023-01-31 LAB — URINE DRUG SCREEN, QUALITATIVE (ARMC ONLY)
Amphetamines, Ur Screen: NOT DETECTED
Barbiturates, Ur Screen: NOT DETECTED
Benzodiazepine, Ur Scrn: POSITIVE — AB
Cannabinoid 50 Ng, Ur ~~LOC~~: NOT DETECTED
Cocaine Metabolite,Ur ~~LOC~~: NOT DETECTED
MDMA (Ecstasy)Ur Screen: NOT DETECTED
Methadone Scn, Ur: NOT DETECTED
Opiate, Ur Screen: NOT DETECTED
Phencyclidine (PCP) Ur S: NOT DETECTED
Tricyclic, Ur Screen: NOT DETECTED

## 2023-01-31 LAB — CK: Total CK: 94 U/L (ref 49–397)

## 2023-01-31 MED ORDER — NICOTINE 21 MG/24HR TD PT24
21.0000 mg | MEDICATED_PATCH | Freq: Every day | TRANSDERMAL | Status: DC
  Administered 2023-01-31: 21 mg via TRANSDERMAL

## 2023-01-31 MED ORDER — POTASSIUM CHLORIDE CRYS ER 20 MEQ PO TBCR
20.0000 meq | EXTENDED_RELEASE_TABLET | Freq: Once | ORAL | Status: AC
  Administered 2023-01-31: 20 meq via ORAL
  Filled 2023-01-31: qty 1

## 2023-01-31 MED ORDER — MAGNESIUM SULFATE 2 GM/50ML IV SOLN
2.0000 g | Freq: Once | INTRAVENOUS | Status: AC
  Administered 2023-01-31: 2 g via INTRAVENOUS
  Filled 2023-01-31: qty 50

## 2023-01-31 MED ORDER — INFLUENZA VIRUS VACC SPLIT PF (FLUZONE) 0.5 ML IM SUSY: 0.5000 mL | PREFILLED_SYRINGE | INTRAMUSCULAR | Status: DC

## 2023-01-31 MED ORDER — POTASSIUM CHLORIDE 10 MEQ/100ML IV SOLN
10.0000 meq | INTRAVENOUS | Status: AC
  Administered 2023-01-31 (×3): 10 meq via INTRAVENOUS
  Filled 2023-01-31: qty 100

## 2023-01-31 NOTE — ED Notes (Signed)
Date and time results received: 01/31/23 0005 (use smartphrase ".now" to insert current time)  Test: potassium Critical Value: 2.6  Name of Provider Notified: Dr Sedalia Muta  Orders Received? Or Actions Taken?:

## 2023-01-31 NOTE — ED Notes (Signed)
PT ambulated with steady gait to the bathroom, pt back to bed. Pt denies other needs at this time.
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
## 2023-01-31 NOTE — ED Notes (Signed)
Pt requested new TV remote, RN gave pt new remote.
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
## 2023-01-31 NOTE — ED Notes (Signed)
Hospitalist at bedside  discussing pt leaving.
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
## 2023-01-31 NOTE — ED Notes (Addendum)
Hospitalist notified of pt request for nicotine patch, and ordered.

## 2023-01-31 NOTE — ED Notes (Signed)
Pt assisted to the bathroom, pt ambulated with steady gait pt denied any dizzinesss, non skid socks place on pt and pt told to use call light if needed
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
## 2023-02-01 ENCOUNTER — Telehealth: Payer: Self-pay | Admitting: *Deleted
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
## 2023-02-05 ENCOUNTER — Ambulatory Visit: Payer: BC Managed Care – PPO | Admitting: Orthopedic Surgery

## 2023-02-06 ENCOUNTER — Other Ambulatory Visit: Payer: BC Managed Care – PPO

## 2023-02-06 NOTE — Patient Outreach (Signed)
  Medicaid Managed Care Social Work Note  02/06/2023 Name:  Christian [name] MRN:  [id number] DOB:  15-Aug-1980  Christian [name] is an 42 y.o. year old male who is a primary patient of Jacky Kindle, FNP.  The [facility] Managed Care Coordination team was consulted for assistance with:  Community Resources   Christian [name] was given information about Medicaid Managed Care Coordination team services today. Christian [name] Patient agreed to services and verbal consent obtained.  Engaged with patient  for by telephone forfollow up visit in response to referral for case management and/or care coordination services.   Assessments/Interventions:  Review of past medical history, allergies, medications, health status, including review of consultants reports, laboratory and other test data, was performed as part of comprehensive evaluation and provision of chronic care management services.  SDOH: (Social Determinant of Health) assessments and interventions performed: SDOH Interventions    Flowsheet Row Telephone from 02/01/2023 in [place] POPULATION HEALTH DEPARTMENT Patient Outreach Telephone from 01/18/2023 in [place] POPULATION HEALTH DEPARTMENT Patient Outreach Telephone from 01/10/2023 in [place] POPULATION HEALTH DEPARTMENT Patient Outreach Telephone from 01/09/2023 in [place] POPULATION HEALTH DEPARTMENT  SDOH Interventions      Transportation Interventions --  [Provided patient with [facility] transportation [phone] -- Payor Benefit  [Provided patient with [facility] transportation] --  Utilities Interventions -- -- Intervention Not Indicated --  Stress Interventions -- [employer], Provide Counseling -- Provide Counseling, Offered [employer]     BSW completed a telephone outreach with patient, he states he did receive the resources BSW sent to him, but he has not used them yet because the Christian [name] is still assisting him. Patient states he has not been able to get to  [place] to apply for foodstamps due to transportation. BSW informed patient that he could apply online. Patient stated he noticed [place] has a bus system, after researching BSW was unable to locate any new transportation resources for [name].   Advanced Directives Status:  Not addressed in this encounter.  Care Plan                 No Known Allergies  Medications Reviewed Today   Medications were not reviewed in this encounter     Patient Active Problem List   Diagnosis Date Noted   Alcohol withdrawal with inpatient treatment (HCC) 01/30/2023   Hypomagnesemia 01/30/2023   Back pain 01/30/2023   Status post thoracic spinal fusion 12/22/2022   Food insecurity 12/22/2022   Threat of job loss 12/22/2022   Housing instability 12/22/2022   Tobacco dependence 12/22/2022   Hyponatremia 12/22/2022   Other secondary hypertension 12/22/2022   Alcohol dependence with alcohol-induced mood disorder (HCC) 12/22/2022   Other chronic postprocedural pain 12/22/2022   Abnormal transaminases 12/22/2022   Thrombocytopenia (HCC) 04/17/2019   Hypokalemia 04/17/2019   Seizures (HCC) 04/17/2019    Conditions to be addressed/monitored per PCP order:   transportation  There are no care plans that you recently modified to display for this patient.   Follow up:  Patient agrees to Care Plan and Follow-up.  Plan: The Managed Medicaid care management team will reach out to the patient again over the next 45 days.  Date/time of next scheduled Social Work care management/care coordination outreach:  03/16/23  Christian [name], Christian [name], [facility] Health  Managed [facility] Social Worker [phone]

## 2023-02-06 NOTE — Patient Instructions (Signed)
Visit Information  Christian [name] was given information about Medicaid Managed Care team care coordination services as a part of their [facility] Community Plan Medicaid benefit. Christian [name] verbally consented to engagement with the [facility] Managed Care team.   If you are experiencing a medical emergency, please call 911 or report to your local emergency department or urgent care.   If you have a non-emergency medical problem during routine business hours, please contact your provider's office and ask to speak with a nurse.   For questions related to your [facility], please call: [phone] or visit the homepage here: [email or web address]  If you would like to schedule transportation through your [facility], please call the following number at least 2 days in advance of your appointment: [phone]   Rides for urgent appointments can also be made after hours by calling Member Services.  Call the Behavioral Health Crisis Line at [phone], at any time, 24 hours a day, 7 days a week. If you are in danger or need immediate medical attention call 911.  If you would like help to quit smoking, call 1-800-QUIT-NOW (732 500 6997) OR Espaol: 1-855-Djelo-Ya (1-324-401-0272) o para ms informacin haga clic aqu or Text READY to 536-644 to register via text  Christian [name] - following are the goals we discussed in your visit today:   Goals Addressed   None       Social Worker will follow up on 03/16/23.   Christian [name], Christian [name], MHA [facility] Health  Managed Medicaid Social Worker [phone]   Following is a copy of your plan of care:  There are no care plans that you recently modified to display for this patient.

## 2023-02-07 ENCOUNTER — Other Ambulatory Visit: Payer: BC Managed Care – PPO | Admitting: Licensed Clinical Social Worker

## 2023-02-07 NOTE — Patient Instructions (Signed)
Visit Information  Thank you for taking time to visit with me today. Please don't hesitate to contact me if I can be of assistance to you.   If you are experiencing a Mental Health or Behavioral Health Crisis or need someone to talk to, please call the Suicide and Crisis Lifeline: 988 call the [place] National Suicide Prevention Lifeline: (613) 882-3800 or TTY: 732-535-3299 TTY 403-708-7015) to talk to a trained counselor call 1-800-273-TALK (toll free, 24 hour hotline) go to [facility] [address], [place] [phone])   The following coping skill education was provided for stress relief and mental health management: "When your car dies or a deadline looms, how do you respond? Long-term, low-grade or acute stress takes a serious toll on your body and mind, so don't ignore feelings of constant tension. Stress is a natural part of life. However, too much stress can harm our health, especially if it continues every day. This is chronic stress and can put you at risk for heart problems like heart disease and depression. Understand what's happening inside your body and learn simple coping skills to combat the negative impacts of everyday stressors.  Types of Stress There are two types of stress: Emotional - types of emotional stress are relationship problems, pressure at work, financial worries, experiencing discrimination or having a major life change. Physical - Examples of physical stress include being sick having pain, not sleeping well, recovery from an injury or having an alcohol and drug use disorder. Fight or Flight Sudden or ongoing stress activates your nervous system and floods your bloodstream with adrenaline and cortisol, two hormones that raise blood pressure, increase heart rate and spike blood sugar. These changes pitch your body into a fight or flight response. That enabled our ancestors to outrun saber-toothed tigers, and it's helpful today  for situations like dodging a car accident. But most modern chronic stressors, such as finances or a challenging relationship, keep your body in that heightened state, which hurts your health. Effects of Too Much Stress If constantly under stress, most of [place] will eventually start to function less well.  Multiple studies link chronic stress to a higher risk of heart disease, stroke, depression, weight gain, memory loss and even premature death, so it's important to recognize the warning signals. Talk to your doctor about ways to manage stress if you're experiencing any of these symptoms: Prolonged periods of poor sleep. Regular, severe headaches. Unexplained weight loss or gain. Feelings of isolation, withdrawal or worthlessness. Constant anger and irritability. Loss of interest in activities. Constant worrying or obsessive thinking. Excessive alcohol or drug use. Inability to concentrate.  10 Ways to Cope with Chronic Stress It's key to recognize stressful situations as they occur because it allows you to focus on managing how you react. We all need to know when to close our eyes and take a deep breath when we feel tension rising. Use these tips to prevent or reduce chronic stress. 1. Rebalance Work and Home All work and no play? If you're spending too much time at the office, intentionally put more dates in your calendar to enjoy time for fun, either alone or with others. 2. Get Regular Exercise Moving your body on a regular basis balances the nervous system and increases blood circulation, helping to flush out stress hormones. Even a daily 20-minute walk makes a difference. Any kind of exercise can lower stress and improve your mood ? just pick activities that you enjoy and make it a regular  habit. 3. Eat Well and Limit Alcohol and Stimulants Alcohol, nicotine and caffeine may temporarily relieve stress but have negative health impacts and can make stress worse in the long run. Well-nourished  bodies cope better, so start with a good breakfast, add more organic fruits and vegetables for a well-balanced diet, avoid processed foods and sugar, try herbal tea and drink more water. 4. Connect with Supportive People Talking face to face with another person releases hormones that reduce stress. Lean on those good listeners in your life. 5. Carve Out Hobby Time Do you enjoy gardening, reading, listening to music or some other creative pursuit? Engage in activities that bring you pleasure and joy; research shows that reduces stress by almost half and lowers your heart rate, too. 6. Practice Meditation, Stress Reduction or Yoga Relaxation techniques activate a state of restfulness that counterbalances your body's fight-or-flight hormones. Even if this also means a 10-minute break in a long day: listen to music, read, go for a walk in nature, do a hobby, take a bath or spend time with a friend. Also consider doing a mindfulness exercise or try a daily deep breathing or imagery practice. Deep Breathing Slow, calm and deep breathing can help you relax. Try these steps to focus on your breathing and repeat as needed. Find a comfortable position and close your eyes. Exhale and drop your shoulders. Breathe in through your nose; fill your lungs and then your belly. Think of relaxing your body, quieting your mind and becoming calm and peaceful. Breathe out slowly through your nose, relaxing your belly. Think of releasing tension, pain, worries or distress. Repeat steps three and four until you feel relaxed. Imagery This involves using your mind to excite the senses -- sound, vision, smell, taste and feeling. This may help ease your stress. Begin by getting comfortable and then do some slow breathing. Imagine a place you love being at. It could be somewhere from your childhood, somewhere you vacationed or just a place in your imagination. Feel how it is to be in the place you're imagining. Pay attention to  the sounds, air, colors, and who is there with you. This is a place where you feel cared for and loved. All is well. You are safe. Take in all the smells, sounds, tastes and feelings. As you do, feel your body being nourished and healed. Feel the calm that surrounds you. Breathe in all the good. Breathe out any discomfort or tension. 7. Sleep Enough If you get less than seven to eight hours of sleep, your body won't tolerate stress as well as it could. If stress keeps you up at night, address the cause, and add extra meditation into your day to make up for the lost z's. Try to get seven to nine hours of sleep each night. Make a regular bedtime schedule. Keep your room dark and cool. Try to avoid computers, TV, cell phones and tablets before bed. 8. Bond with Connections You Enjoy Go out for a coffee with a friend, chat with a neighbor, call a family member, visit with a clergy member, or even hang out with your pet. Clinical studies show that spending even a short time with a companion animal can cut anxiety levels almost in half. 9. Take a Vacation Getting away from it all can reset your stress tolerance by increasing your mental and emotional outlook, which makes you a happier, more productive person upon return. Leave your cellphone and laptop at home! 10. See a Veterinary surgeon, Coach or  Therapist If negative thoughts overwhelm your ability to make positive changes, it's time to seek professional help. Make an appointment today--your health and life are worth it."  Dickie La, BSW, MSW, [employer] Managed Medicaid LCSW [facility]  [facility] [email or web address] Phone: [phone]

## 2023-02-07 NOTE — Patient Outreach (Signed)
Medicaid Managed Care Social Work Note  02/07/2023 Name:  Christian [name] MRN:  [id number] DOB:  [birth year]  Christian [name] is an 42 y.o. year old male who is a primary patient of Christian Kindle, FNP.  The Medicaid Managed Care Coordination team was consulted for assistance with:  Mental Health Counseling and Resources  Christian [name] was given information about Medicaid Managed Care Coordination team services today. Christian [name] Patient agreed to services and verbal consent obtained.  Engaged with patient  for by telephone forfollow up visit in response to referral for case management and/or care coordination services.   Assessments/Interventions:  Review of past medical history, allergies, medications, health status, including review of consultants reports, laboratory and other test data, was performed as part of comprehensive evaluation and provision of chronic care management services.  SDOH: (Social Determinant of Health) assessments and interventions performed: SDOH Interventions    Flowsheet Row Patient Outreach Telephone from 02/07/2023 in Christian [name] Telephone from 02/01/2023 in Christian [name] Patient Outreach Telephone from 01/18/2023 in Christian [name] Patient Outreach Telephone from 01/10/2023 in Christian [name] Patient Outreach Telephone from 01/09/2023 in  POPULATION HEALTH [name]  SDOH Interventions       Transportation Interventions -- --  [Provided patient with Christian [name] transportation [phone] -- Payor Benefit  [Provided patient with Christian [name] transportation] --  Utilities Interventions -- -- -- Intervention Not Indicated --  Financial Strain Interventions Other (Comment)  Christian [name] BSW involved] -- -- -- --  Stress Interventions Offered [employer], Provide Counseling  [Pt denies wanting BH resource implementation at this time but will contact this Christian [name] in the  future once things are more settled] -- [employer], Provide Counseling -- Provide Counseling, [employer] Resources       Advanced Directives Status:  See Care Plan for related entries.  Care Plan                 No Known Allergies  Medications Reviewed Today   Medications were not reviewed in this encounter     Patient Active Problem List   Diagnosis Date Noted   Alcohol withdrawal with inpatient treatment (HCC) 01/30/2023   Hypomagnesemia 01/30/2023   Back pain 01/30/2023   Status post thoracic spinal fusion 12/22/2022   Food insecurity 12/22/2022   Threat of job loss 12/22/2022   Housing instability 12/22/2022   Tobacco dependence 12/22/2022   Hyponatremia 12/22/2022   Other secondary hypertension 12/22/2022   Alcohol dependence with alcohol-induced mood disorder (HCC) 12/22/2022   Other chronic postprocedural pain 12/22/2022   Abnormal transaminases 12/22/2022   Thrombocytopenia (HCC) 04/17/2019   Hypokalemia 04/17/2019   Seizures (HCC) 04/17/2019   Timeframe:  Short-Range Goal Priority:  High Start Date:   01/18/23             Expected End Date:  ongoing                     Follow Up Date--03/21/23 at 11 am  - keep 90 percent of scheduled appointments -consider counseling or psychiatry -consider bumping up your self-care  -consider creating a stronger support network   Why is this important?             Combatting depression may take some time.            If you don't feel better right away, don't give up on your treatment plan.  Current barriers:   Chronic Mental Health needs related to alcohol usage, depression, stress and anxiety. Recent separation from spouse Recent ED visit for alcohol withdrawal on 12/12/22 Patient requires Support, Education, Resources, Referrals, Advocacy, and Care Coordination, in order to meet Unmet Mental Health Needs. (To Find a Visual merchandiser) Patient will implement clinical  interventions discussed today to decrease symptoms of depression and increase knowledge and/or ability of: coping skills. Mental Health Concerns and Social Isolation Financial needs Patient lacks knowledge of available community counseling agencies and resources.  Clinical Goal(s): verbalize understanding of plan for management of Alcohol Use Disorder, Anxiety, Depression, and Stress and demonstrate a reduction in symptoms. Patient will connect with a provider for ongoing mental health treatment, increase coping skills, healthy habits, self-management skills, and stress reduction        Clinical Interventions:  Assessed patient's previous and current treatment, coping skills, support system and barriers to care. Patient provided hx  Verbalization of feelings encouraged, motivational interviewing employed Emotional support provided, positive coping strategies explored. Establishing healthy boundaries emphasized and healthy self-care education provided Patient was educated on available mental health resources within their area that accept Medicaid and offer counseling and psychiatry. Patient was advised to contact the back of their insurance cards (both) for assistance with benefits as well. Patient had a recent separation from his spouse which has affected his emotional and financial support.  Patient educated on the difference between therapy and psychiatry per patient request Email sent to patient today with available mental health resources within his area that accept Medicaid and offer the services that she is interested in. Email included instructions for scheduling at [name] as well as some crisis support resources and [facility]'s walk in clinic hours.  Emotional support provided. CBT intervention implemented regarding "being mentally fit" by combating negative thinking and replacing it with uplifting support, hope and positivity. Patient was successful in identifying triggers to anxiety and  depression symptoms, in addition, to healthy coping skills. Patient admits to ongoing alcohol usage and was educated on the mental and physical consequences of this negative coping mechanism. AA support groups encouraged  Assessed social determinant of health barriers Motivational Interviewing employed Depression screen reviewed  PHQ2/ PHQ9 completed or reviewed  Mindfulness or Relaxation training provided Active listening / Reflection utilized  Advance Care and HCPOA education provided Emotional Support Provided Problem Solving /Task Center strategies reviewed Provided psychoeducation for mental health needs  Provided brief CBT  Reviewed mental health medications and discussed importance of compliance:  Quality of sleep assessed & Sleep Hygiene techniques promoted  Participation in counseling encouraged  Verbalization of feelings encouraged  Suicidal Ideation/Homicidal Ideation assessed: Patient denies SI/HI  Review resources, discussed options and provided patient information about  Mental Health Resources Inter-disciplinary care team collaboration (see longitudinal plan of care) AA meeting list emailed to patient today which his permission. Patient reports that things are very stressful right now due to his recent separation and he would prefer to wait on any behavioral health referrals. [facility] [name] will complete a 30 day follow up to ensure that AA resources were successfully received and that he has no further clinical social work needs to address. Brief self-care and stress management coping skill education provided as well as emotional support.    Patient Goals/Self-Care Activities: Take medications as prescribed   Attend all scheduled provider appointments Call pharmacy for medication refills 3-7 days in advance of running out of medications Perform all self care activities independently  Perform IADL's (shopping, preparing  meals, housekeeping, managing finances) independently Call  provider office for new concerns or questions Work with the social worker to address care coordination needs and will continue to work with the clinical team to address health care and disease management related needs call 1-800-273-TALK (toll free, 24 hour hotline) go to [facility] [address], [place] [phone]) call 911 if experiencing a Mental Health or Behavioral Health Crisis  Utilize healthy coping skills and supportive resources discussed Contact PCP with any questions or concerns Keep 90 percent of counseling appointments Call your insurance provider for more information about your Enhanced Benefits  Check out counseling resources provided  Begin personal counseling with [name], to reduce and manage symptoms of Depression and Stress, until well-established with mental health provider Accept all calls from representative with [facility] in an effort to establish ongoing mental health counseling and supportive services. Incorporate into daily practice - relaxation techniques, deep breathing exercises, and mindfulness meditation strategies. Talk about feelings with friends, family members, spiritual advisor, etc. Contact [name] directly [phone]), if you have questions, need assistance, or if additional social work needs are identified between now and our next scheduled telephone outreach call. Call 988 for mental health hotline/crisis line if needed (24/7 available) Try techniques to reduce symptoms of anxiety/negative thinking (deep breathing, distraction, positive self talk, etc)  - develop a personal safety plan - develop a plan to deal with triggers like holidays, anniversaries - exercise at least 2 to 3 times per week - have a plan for how to handle bad days - journal feelings and what helps to feel better or worse - spend time or talk with others at least 2 to 3 times per week - watch for early signs of feeling worse - begin personal  counseling - call and visit an old friend - check out volunteer opportunities - join a support group - laugh; watch a funny movie or comedian - learn and use visualization or guided imagery - perform a random act of kindness - practice relaxation or meditation daily - start or continue a personal journal - practice positive thinking and self-talk -continue with compliance of taking medication  -identify current effective and ineffective coping strategies.  -implement positive self-talk in care to increase self-esteem, confidence and feelings of control.  -consider alternative and complementary therapy approaches such as meditation, mindfulness or yoga.  Call your insurance provider to gain education on benefits if desired Call your primary care doctor if symptoms get worse  -journaling, prayer, worship services, meditation or pastoral counseling.  -increase participation in pleasurable group activities such as hobbies, singing, sports or volunteering).  -consider the use of meditative movement therapy such as tai chi, yoga or qigong.  -start a regular daily exercise program based on tolerance, ability and patient choice to support positive thinking and activity    Follow Up Plan:  The patient has been provided with contact information for the care management team and has been advised to call with any mental health or health related questions or concerns.  The care management team will reach out to the patient again over the next 30 business  days.   If you are experiencing a Mental Health or Behavioral Health Crisis or need someone to talk to, please call the Suicide and Crisis Lifeline: 988    Patient Goals: Follow up goal  Review of patient status, including review of consultants reports, relevant laboratory and other test results, and collaboration with appropriate care team members and the  patient's provider was performed as part of comprehensive patient evaluation and provision of  services.     Follow up:  Patient agrees to Care Plan and Follow-up.  Plan: The Managed Medicaid care management team will reach out to the patient again over the next 30 days.  Dickie La, BSW, MSW, [employer] Managed Medicaid [name]  [facility] [email or web address] Phone: [phone]

## 2023-02-14 ENCOUNTER — Other Ambulatory Visit: Payer: BC Managed Care – PPO | Admitting: *Deleted

## 2023-02-14 NOTE — Patient Outreach (Signed)
Medicaid Managed Care   Nurse Care Manager Note  02/14/2023 Name:  Christian [name] MRN:  [id number] DOB:  [birth year]  Christian [name] is an 42 y.o. year old male who is a primary patient of Jacky Kindle, FNP.  The [facility] Managed Care Coordination team was consulted for assistance with:    Pain  Mr. Brockschmidt was given information about Medicaid Managed Care Coordination team services today. [name] Patient agreed to services and verbal consent obtained.  Engaged with patient by telephone for follow up visit in response to provider referral for case management and/or care coordination services.   Assessments/Interventions:  Review of past medical history, allergies, medications, health status, including review of consultants reports, laboratory and other test data, was performed as part of comprehensive evaluation and provision of chronic care management services.  SDOH (Social Determinants of Health) assessments and interventions performed: SDOH Interventions    Flowsheet Row Patient Outreach Telephone from 02/07/2023 in [place] POPULATION HEALTH DEPARTMENT Telephone from 02/01/2023 in [place] POPULATION HEALTH DEPARTMENT Patient Outreach Telephone from 01/18/2023 in [place] POPULATION HEALTH DEPARTMENT Patient Outreach Telephone from 01/10/2023 in [place] POPULATION HEALTH DEPARTMENT Patient Outreach Telephone from 01/09/2023 in [place] POPULATION HEALTH DEPARTMENT  SDOH Interventions       Transportation Interventions -- --  [Provided patient with [facility] transportation [phone] -- Payor Benefit  [Provided patient with UHC transportation] --  Utilities Interventions -- -- -- Intervention Not Indicated --  Financial Strain Interventions Other (Comment)  [facility] BSW involved] -- -- -- --  Stress Interventions Offered [employer], Provide Counseling  [Pt denies wanting BH resource implementation at this time but will contact this [facility] LCSW in the future once things  are more settled] -- [employer], Provide Counseling -- Provide Counseling, [employer] Resources       Care Plan  No Known Allergies  Medications Reviewed Today   Medications were not reviewed in this encounter     Patient Active Problem List   Diagnosis Date Noted   Alcohol withdrawal with inpatient treatment (HCC) 01/30/2023   Hypomagnesemia 01/30/2023   Back pain 01/30/2023   Status post thoracic spinal fusion 12/22/2022   Food insecurity 12/22/2022   Threat of job loss 12/22/2022   Housing instability 12/22/2022   Tobacco dependence 12/22/2022   Hyponatremia 12/22/2022   Other secondary hypertension 12/22/2022   Alcohol dependence with alcohol-induced mood disorder (HCC) 12/22/2022   Other chronic postprocedural pain 12/22/2022   Abnormal transaminases 12/22/2022   Thrombocytopenia (HCC) 04/17/2019   Hypokalemia 04/17/2019   Seizures (HCC) 04/17/2019    Conditions to be addressed/monitored per PCP order:   Pain  Care Plan : RN Care Manager Plan of Care  Updates made by [name], RN since 02/14/2023 12:00 AM     Problem: Health Management needs related to Chronic Pain      Long-Range Goal: Development of Plan of Care to address Health Management needs related to Chronic Pain   Start Date: 01/10/2023  Expected End Date: 04/10/2023  Note:   Current Barriers:  Chronic Disease Management support and education needs related to Chronic Pain Mr. Legner was scheduled with Neurosurgery on 02/05/23, and will need to reschedule.   RNCM Clinical Goal(s):  Patient will verbalize understanding of plan for management of Chronic Pain as evidenced by patient reports attend all scheduled medical appointments: 01/11/23 with Neurosurgery as evidenced by provider documentation in EMR        continue to work  with RN Care Manager and/or Social Worker to address care management and care coordination needs related to Chronic Pain as evidenced  by adherence to CM Team Scheduled appointments     through collaboration with RN Care manager, provider, and care team.   Interventions: Evaluation of current treatment plan related to  self management and patient's adherence to plan as established by provider Provided therapeutic listening   Pain:  (Status: Goal on Track (progressing): YES.) Long Term Goal  Pain assessment performed Medications reviewed Reviewed provider established plan for pain management; Advised patient to report to care team affect of pain on daily activities; Discussed use of relaxation techniques and/or diversional activities to assist with pain reduction (distraction, imagery, relaxation, massage, acupressure, TENS, heat, and cold application; Advised patient to discuss Physical Therapy with provider; Assessed social determinant of health barriers;  Discussed the benefits of exercise to improve pain Advised patient to contact [facility] Member Services [phone] to inquire about member benefits(Free Gym membership, vision and dental providers) Advised patient to reschedule missed appointment with Neurosurgery Reviewed recent ED visit notes and labs with patient Advised patient to follow up with PCP Provided information regarding low potassium and potassium rich foods  Patient Goals/Self-Care Activities: Take medications as prescribed   Attend all scheduled provider appointments Call provider office for new concerns or questions        Follow Up:  Patient agrees to Care Plan and Follow-up.  Plan: The Managed Medicaid care management team will reach out to the patient again over the next 30 days.  Date/time of next scheduled RN care management/care coordination outreach:  03/28/23 at 10:30am  [name] RN, BSN [place]  [facility] RN Care Coordinator [phone]

## 2023-02-14 NOTE — Patient Instructions (Signed)
Visit Information  [name] was given information about Medicaid Managed Care team care coordination services as a part of their [facility] Community Plan Medicaid benefit. Christian [name] verbally consented to engagement with the [facility] Managed Care team.   If you are experiencing a medical emergency, please call 911 or report to your local emergency department or urgent care.   If you have a non-emergency medical problem during routine business hours, please contact your provider's office and ask to speak with a nurse.   For questions related to your [facility], please call: [phone] or visit the homepage here: [email or web address]  If you would like to schedule transportation through your [facility], please call the following number at least 2 days in advance of your appointment: [phone]   Rides for urgent appointments can also be made after hours by calling Member Services.  Call the Behavioral Health Crisis Line at ([phone], at any time, 24 hours a day, 7 days a week. If you are in danger or need immediate medical attention call 911.  If you would like help to quit smoking, call 1-800-QUIT-NOW ((954)209-6184) OR Espaol: 1-855-Djelo-Ya (0-347-425-9563) o para ms informacin haga clic aqu or Text READY to 875-643 to register via text  [name],   Please see education materials related to Potassium provided by MyChart link.  Patient verbalizes understanding of instructions and care plan provided today and agrees to view in MyChart. Active MyChart status and patient understanding of how to access instructions and care plan via MyChart confirmed with patient.     Telephone follow up appointment with Managed Medicaid care management team member scheduled for:03/28/23 at 10:30am  Christian [name] Christian [name], BSN [place]  Value-Based Care Institute [facility]  Health Christian [name] Care Coordinator [phone]   Following is a copy of your plan of care:  Care Plan : Christian [name] Care Manager Plan of Care  Updates made by Christian [name], Christian [name] since 02/14/2023 12:00 AM     Problem: Health Management needs related to Chronic Pain      Long-Range Goal: Development of Plan of Care to address Health Management needs related to Chronic Pain   Start Date: 01/10/2023  Expected End Date: 04/10/2023  Note:   Current Barriers:  Chronic Disease Management support and education needs related to Chronic Pain Christian [name] was scheduled with Neurosurgery on 02/05/23, and will need to reschedule.   RNCM Clinical Goal(s):  Patient will verbalize understanding of plan for management of Chronic Pain as evidenced by patient reports attend all scheduled medical appointments: 01/11/23 with Neurosurgery as evidenced by provider documentation in EMR        continue to work with Christian [name] Care Manager and/or Social Worker to address care management and care coordination needs related to Chronic Pain as evidenced by adherence to CM Team Scheduled appointments     through collaboration with Christian [name] Care manager, provider, and care team.   Interventions: Evaluation of current treatment plan related to  self management and patient's adherence to plan as established by provider Provided therapeutic listening   Pain:  (Status: Goal on Track (progressing): YES.) Long Term Goal  Pain assessment performed Medications reviewed Reviewed provider established plan for pain management; Advised patient to report to care team affect of pain on daily activities; Discussed use of relaxation techniques and/or diversional activities to assist with pain reduction (distraction, imagery, relaxation, massage, acupressure, TENS, heat, and cold application; Advised patient to discuss Physical Therapy with provider; Assessed social  determinant of health barriers;  Discussed the benefits of exercise to improve pain Advised  patient to contact [facility] Member Services [phone] to inquire about member benefits(Free Gym membership, vision and dental providers) Advised patient to reschedule missed appointment with Neurosurgery Reviewed recent ED visit notes and labs with patient Advised patient to follow up with PCP Provided information regarding low potassium and potassium rich foods  Patient Goals/Self-Care Activities: Take medications as prescribed   Attend all scheduled provider appointments Call provider office for new concerns or questions

## 2023-02-27 ENCOUNTER — Encounter: Payer: Self-pay | Admitting: Orthopedic Surgery

## 2023-03-16 ENCOUNTER — Other Ambulatory Visit: Payer: Self-pay

## 2023-03-16 NOTE — Patient Instructions (Signed)
  Medicaid Managed Care   Unsuccessful Outreach Note  03/16/2023 Name: Christian [name] MRN: [id number] DOB: [birth year]  Referred by: Jacky Kindle, FNP Reason for referral : High Risk Managed Medicaid (MM Social work unsuccessful telephone outreach)   An unsuccessful telephone outreach was attempted today. The patient was referred to the case management team for assistance with care management and care coordination.   Follow Up Plan: A HIPAA compliant phone message was left for the patient providing contact information and requesting a return call.   [name], MHA [facility]  Managed [facility] Social Worker [phone]

## 2023-03-16 NOTE — Patient Outreach (Signed)
  Medicaid Managed Care   Unsuccessful Outreach Note  03/16/2023 Name: Christian [name] MRN: [id number] DOB: [birth year]  Referred by: Jacky Kindle, FNP Reason for referral : High Risk Managed Medicaid (MM Social work unsuccessful telephone outreach)   An unsuccessful telephone outreach was attempted today. The patient was referred to the case management team for assistance with care management and care coordination.   Follow Up Plan: A HIPAA compliant phone message was left for the patient providing contact information and requesting a return call.   [name], MHA [facility]  Managed [facility] Social Worker [phone]

## 2023-03-21 ENCOUNTER — Other Ambulatory Visit: Payer: Self-pay | Admitting: Licensed Clinical Social Worker

## 2023-03-21 NOTE — Patient Instructions (Signed)
[name] ,   The [facility] Managed Care Team is available to provide assistance to you with your healthcare needs at no cost and as a benefit of your [facility] plan. We have been unable to reach you on 3 separate attempts. The care management team is available to assist with your healthcare needs at any time. Please do not hesitate to contact me at the number below. .   Thank you,   Dickie La, BSW, MSW, LCSW Managed Medicaid LCSW [facility]  [address] [email or web address] Phone: [phone]

## 2023-03-21 NOTE — Patient Outreach (Signed)
  Medicaid Managed Care   Unsuccessful Attempt Note   03/21/2023 Name: Christian [name] MRN: [id number] DOB: 09-Jan-1981  Referred by: Jacky Kindle, FNP Reason for referral : No chief complaint on file.   Third unsuccessful telephone outreach was attempted today. The patient was referred to the case management team for assistance with care management and care coordination. The patient's primary care provider has been notified of our unsuccessful attempts to make or maintain contact with the patient. The care management team is pleased to engage with this patient at any time in the future should he/she be interested in assistance from the care management team.    Follow Up Plan: A HIPAA compliant phone message was left for the patient providing contact information and requesting a return call.   Dickie La, BSW, MSW, [employer] Managed Medicaid LCSW [facility]  [facility] [email or web address] Phone: [phone]

## 2023-03-23 ENCOUNTER — Telehealth: Payer: Self-pay

## 2023-03-23 NOTE — Telephone Encounter (Signed)
..   Medicaid Managed Care   Unsuccessful Outreach Note  03/23/2023 Name: Christian [name] MRN: [id number] DOB: 05/15/1981  Referred by: Jacky Kindle, FNP Reason for referral : Appointment (I called the patient today to get him rescheduled for his missed phone appt with the MM BSW. He did not answer and I was not able to leave a message.)   A second unsuccessful telephone outreach was attempted today. The patient was referred to the case management team for assistance with care management and care coordination.   Follow Up Plan: The care management team will reach out to the patient again over the next 7 days.   Weston Settle Care Guide  [facility] Managed  Care Guide [facility]  [phone]

## 2023-03-28 ENCOUNTER — Other Ambulatory Visit: Payer: Self-pay | Admitting: *Deleted

## 2023-03-28 DIAGNOSIS — H5213 Myopia, bilateral: Secondary | ICD-10-CM | POA: Diagnosis not present

## 2023-03-28 NOTE — Patient Outreach (Signed)
   Care Management/Care Coordination  RN Case Manager Case Closure Note  03/28/2023 Name: Christian [name] MRN: [id number] DOB: 1980/09/03  Christian [name] is a 42 y.o. year old male who is a primary care patient of Christian Kindle, FNP. The care management/care coordination team was consulted for assistance with chronic disease management and/or care coordination needs.   Care Plan : RN Care Manager Plan of Care  Updates made by Christian Dach, RN since 03/28/2023 12:00 AM  Completed 03/28/2023   Problem: Health Management needs related to Chronic Pain Resolved 03/28/2023     Long-Range Goal: Development of Plan of Care to address Health Management needs related to Chronic Pain Completed 03/28/2023  Start Date: 01/10/2023  Expected End Date: 04/10/2023  Note:   Current Barriers:  Chronic Disease Management support and education needs related to Chronic Pain [name] has not rescheduled with Neurosurgery. He is managing his pain with Tylenol.   RNCM Clinical Goal(s):  Patient will verbalize understanding of plan for management of Chronic Pain as evidenced by patient reports attend all scheduled medical appointments: reschedule with Neurosurgery as evidenced by provider documentation in EMR        continue to work with RN Care Manager and/or Social Worker to address care management and care coordination needs related to Chronic Pain as evidenced by adherence to CM Team Scheduled appointments     through collaboration with RN Care manager, provider, and care team.   Interventions: Evaluation of current treatment plan related to  self management and patient's adherence to plan as established by provider Provided therapeutic listening   Pain:  (Status: Goal Not Met.) Long Term Goal  Pain assessment performed-back pain 4-5/10 Medications reviewed Reviewed provider established plan for pain management; Advised patient to report to care team affect of pain on daily activities; Discussed use  of relaxation techniques and/or diversional activities to assist with pain reduction (distraction, imagery, relaxation, massage, acupressure, TENS, heat, and cold application; Advised patient to discuss Physical Therapy with provider; Assessed social determinant of health barriers;  Discussed the benefits of exercise to improve pain Advised patient to contact [facility] Member Services [phone] to inquire about member benefits(Free Gym membership, vision and dental providers) Advised patient to reschedule missed appointment with Neurosurgery Reviewed recent ED visit notes and labs with patient Advised patient to follow up with PCP Provided information regarding low potassium and potassium rich foods  Patient Goals/Self-Care Activities: Take medications as prescribed   Attend all scheduled provider appointments Call provider office for new concerns or questions        Plan: All appropriate care management assessments have been performed and a patient centered care plan has been developed in collaboration with the patient subsequent to referral for care management services. The patient has been provided with exhaustive education and care management resources but has not met established and adjusted patient centered goals. The care management team has no additional activities or interventions to offer at this time.  Should new needs arise, the care management team is available to collaborate with the provider, care team and patient. The patient has been provided contact information for the care management team.  Christian Emms RN, BSN [place]  [facility] RN Care Coordinator [phone]

## 2023-09-02 ENCOUNTER — Observation Stay
Admission: EM | Admit: 2023-09-02 | Discharge: 2023-09-03 | Discharge status: Against Medical Advice | Attending: Student | Admitting: Student

## 2023-09-02 ENCOUNTER — Emergency Department

## 2023-09-02 ENCOUNTER — Other Ambulatory Visit: Payer: Self-pay

## 2023-09-02 DIAGNOSIS — B370 Candidal stomatitis: Secondary | ICD-10-CM

## 2023-09-02 DIAGNOSIS — D696 Thrombocytopenia, unspecified: Secondary | ICD-10-CM | POA: Diagnosis present

## 2023-09-02 DIAGNOSIS — F10930 Alcohol use, unspecified with withdrawal, uncomplicated: Secondary | ICD-10-CM | POA: Diagnosis not present

## 2023-09-02 DIAGNOSIS — F17200 Nicotine dependence, unspecified, uncomplicated: Secondary | ICD-10-CM | POA: Diagnosis present

## 2023-09-02 DIAGNOSIS — E871 Hypo-osmolality and hyponatremia: Secondary | ICD-10-CM | POA: Diagnosis present

## 2023-09-02 DIAGNOSIS — F1020 Alcohol dependence, uncomplicated: Secondary | ICD-10-CM | POA: Insufficient documentation

## 2023-09-02 DIAGNOSIS — L738 Other specified follicular disorders: Secondary | ICD-10-CM | POA: Insufficient documentation

## 2023-09-02 DIAGNOSIS — F10939 Alcohol use, unspecified with withdrawal, unspecified: Secondary | ICD-10-CM | POA: Diagnosis present

## 2023-09-02 DIAGNOSIS — S52021A Displaced fracture of olecranon process without intraarticular extension of right ulna, initial encounter for closed fracture: Secondary | ICD-10-CM | POA: Insufficient documentation

## 2023-09-02 DIAGNOSIS — E878 Other disorders of electrolyte and fluid balance, not elsewhere classified: Secondary | ICD-10-CM | POA: Insufficient documentation

## 2023-09-02 DIAGNOSIS — R58 Hemorrhage, not elsewhere classified: Secondary | ICD-10-CM | POA: Insufficient documentation

## 2023-09-02 DIAGNOSIS — B002 Herpesviral gingivostomatitis and pharyngotonsillitis: Secondary | ICD-10-CM

## 2023-09-02 DIAGNOSIS — R569 Unspecified convulsions: Principal | ICD-10-CM

## 2023-09-02 LAB — URINE DRUG SCREEN, QUALITATIVE (ARMC ONLY)
Amphetamines, Ur Screen: NOT DETECTED
Barbiturates, Ur Screen: NOT DETECTED
Benzodiazepine, Ur Scrn: POSITIVE — AB
Cannabinoid 50 Ng, Ur ~~LOC~~: NOT DETECTED
Cocaine Metabolite,Ur ~~LOC~~: NOT DETECTED
MDMA (Ecstasy)Ur Screen: NOT DETECTED
Methadone Scn, Ur: NOT DETECTED
Opiate, Ur Screen: NOT DETECTED
Phencyclidine (PCP) Ur S: NOT DETECTED
Tricyclic, Ur Screen: NOT DETECTED

## 2023-09-02 LAB — URINALYSIS, ROUTINE W REFLEX MICROSCOPIC
Bilirubin Urine: NEGATIVE
Glucose, UA: NEGATIVE mg/dL
Ketones, ur: NEGATIVE mg/dL
Leukocytes,Ua: NEGATIVE
Nitrite: NEGATIVE
Protein, ur: NEGATIVE mg/dL
Specific Gravity, Urine: 1.009 (ref 1.005–1.030)
Squamous Epithelial / HPF: 0 /HPF (ref 0–5)
pH: 6 (ref 5.0–8.0)

## 2023-09-02 LAB — CBC WITH DIFFERENTIAL/PLATELET
Abs Immature Granulocytes: 0.05 10*3/uL (ref 0.00–0.07)
Basophils Absolute: 0 10*3/uL (ref 0.0–0.1)
Basophils Relative: 0 %
Eosinophils Absolute: 0 10*3/uL (ref 0.0–0.5)
Eosinophils Relative: 1 %
HCT: 38.5 % — ABNORMAL LOW (ref 39.0–52.0)
Hemoglobin: 14.2 g/dL (ref 13.0–17.0)
Immature Granulocytes: 1 %
Lymphocytes Relative: 16 %
Lymphs Abs: 1.3 10*3/uL (ref 0.7–4.0)
MCH: 30.8 pg (ref 26.0–34.0)
MCHC: 36.9 g/dL — ABNORMAL HIGH (ref 30.0–36.0)
MCV: 83.5 fL (ref 80.0–100.0)
Monocytes Absolute: 0.5 10*3/uL (ref 0.1–1.0)
Monocytes Relative: 7 %
Neutro Abs: 5.9 10*3/uL (ref 1.7–7.7)
Neutrophils Relative %: 75 %
Platelets: 101 10*3/uL — ABNORMAL LOW (ref 150–400)
RBC: 4.61 MIL/uL (ref 4.22–5.81)
RDW: 14.5 % (ref 11.5–15.5)
WBC: 7.8 10*3/uL (ref 4.0–10.5)
nRBC: 0.3 % — ABNORMAL HIGH (ref 0.0–0.2)

## 2023-09-02 LAB — MAGNESIUM: Magnesium: 1.3 mg/dL — ABNORMAL LOW (ref 1.7–2.4)

## 2023-09-02 LAB — COMPREHENSIVE METABOLIC PANEL WITH GFR
ALT: 41 U/L (ref 0–44)
AST: 108 U/L — ABNORMAL HIGH (ref 15–41)
Albumin: 3.1 g/dL — ABNORMAL LOW (ref 3.5–5.0)
Alkaline Phosphatase: 103 U/L (ref 38–126)
Anion gap: 14 (ref 5–15)
BUN: <5 mg/dL — ABNORMAL LOW (ref 6–20)
CO2: 22 mmol/L (ref 22–32)
Calcium: 7.9 mg/dL — ABNORMAL LOW (ref 8.9–10.3)
Chloride: 94 mmol/L — ABNORMAL LOW (ref 98–111)
Creatinine, Ser: 0.51 mg/dL — ABNORMAL LOW (ref 0.61–1.24)
GFR, Estimated: >60 mL/min (ref >60)
Glucose, Bld: 110 mg/dL — ABNORMAL HIGH (ref 70–99)
Potassium: 3.3 mmol/L — ABNORMAL LOW (ref 3.5–5.1)
Sodium: 130 mmol/L — ABNORMAL LOW (ref 135–145)
Total Bilirubin: 4.3 mg/dL — ABNORMAL HIGH (ref 0.0–1.2)
Total Protein: 6.5 g/dL (ref 6.5–8.1)

## 2023-09-02 LAB — PROTIME-INR
INR: 1.1 (ref 0.8–1.2)
Prothrombin Time: 14.2 s (ref 11.4–15.2)

## 2023-09-02 LAB — RAPID HIV SCREEN (HIV 1/2 AB+AG)
HIV 1/2 Antibodies: NONREACTIVE
HIV-1 P24 Antigen - HIV24: NONREACTIVE

## 2023-09-02 LAB — APTT: aPTT: 27 s (ref 24–36)

## 2023-09-02 LAB — ETHANOL: Alcohol, Ethyl (B): <10 mg/dL (ref <10)

## 2023-09-02 LAB — LIPASE, BLOOD: Lipase: 20 U/L (ref 11–51)

## 2023-09-02 MED ORDER — LORAZEPAM 2 MG PO TABS: 0.0000 mg | ORAL_TABLET | Freq: Two times a day (BID) | ORAL | Status: DC

## 2023-09-02 MED ORDER — NYSTATIN 100000 UNIT/ML MT SUSP
5.0000 mL | Freq: Four times a day (QID) | OROMUCOSAL | 0 refills | Status: AC
Start: 2023-09-02 — End: 2023-09-12
  Filled 2023-09-02: qty 60, 3d supply, fill #0

## 2023-09-02 MED ORDER — ONDANSETRON HCL 4 MG PO TABS: 4.0000 mg | ORAL_TABLET | Freq: Four times a day (QID) | ORAL | Status: DC | PRN

## 2023-09-02 MED ORDER — POTASSIUM CITRATE-CITRIC ACID 1100-334 MG/5ML PO SOLN
30.0000 meq | Freq: Once | ORAL | Status: AC
  Administered 2023-09-02: 30 meq via ORAL
  Filled 2023-09-02: qty 15

## 2023-09-02 MED ORDER — ACETAMINOPHEN 325 MG RE SUPP: 650.0000 mg | Freq: Four times a day (QID) | RECTAL | Status: DC | PRN

## 2023-09-02 MED ORDER — SODIUM CHLORIDE 0.9 % IV BOLUS
1000.0000 mL | Freq: Once | INTRAVENOUS | Status: AC
  Administered 2023-09-02: 1000 mL via INTRAVENOUS

## 2023-09-02 MED ORDER — NICOTINE 21 MG/24HR TD PT24
21.0000 mg | MEDICATED_PATCH | Freq: Every day | TRANSDERMAL | Status: DC | PRN
  Administered 2023-09-02: 21 mg via TRANSDERMAL
  Filled 2023-09-02: qty 1

## 2023-09-02 MED ORDER — MAGNESIUM SULFATE 2 GM/50ML IV SOLN
2.0000 g | Freq: Once | INTRAVENOUS | Status: AC
  Administered 2023-09-02: 2 g via INTRAVENOUS
  Filled 2023-09-02: qty 50

## 2023-09-02 MED ORDER — LORAZEPAM 2 MG/ML IJ SOLN
1.0000 mg | Freq: Once | INTRAMUSCULAR | Status: AC
  Administered 2023-09-02: 1 mg via INTRAVENOUS
  Filled 2023-09-02: qty 1

## 2023-09-02 MED ORDER — LORAZEPAM 2 MG/ML IJ SOLN: 0.0000 mg | Freq: Four times a day (QID) | INTRAMUSCULAR | Status: DC

## 2023-09-02 MED ORDER — LORAZEPAM 2 MG/ML IJ SOLN: 0.0000 mg | Freq: Two times a day (BID) | INTRAMUSCULAR | Status: DC

## 2023-09-02 MED ORDER — ONDANSETRON HCL 4 MG/2ML IJ SOLN: 4.0000 mg | Freq: Four times a day (QID) | INTRAMUSCULAR | Status: DC | PRN

## 2023-09-02 MED ORDER — THIAMINE MONONITRATE 100 MG PO TABS: 100.0000 mg | ORAL_TABLET | Freq: Every day | ORAL | Status: DC

## 2023-09-02 MED ORDER — ACYCLOVIR 400 MG PO TABS
400.0000 mg | ORAL_TABLET | Freq: Every day | ORAL | 0 refills | Status: AC
  Filled 2023-09-02: qty 25, 5d supply, fill #0

## 2023-09-02 MED ORDER — LORAZEPAM 2 MG/ML IJ SOLN: 2.0000 mg | Freq: Once | INTRAMUSCULAR | Status: DC

## 2023-09-02 MED ORDER — LORAZEPAM 2 MG PO TABS: 0.0000 mg | ORAL_TABLET | Freq: Four times a day (QID) | ORAL | Status: DC

## 2023-09-02 MED ORDER — THIAMINE HCL 100 MG/ML IJ SOLN
100.0000 mg | Freq: Every day | INTRAMUSCULAR | Status: DC
  Administered 2023-09-02: 100 mg via INTRAVENOUS
  Filled 2023-09-02: qty 2

## 2023-09-02 MED ORDER — ACETAMINOPHEN 325 MG PO TABS: 650.0000 mg | ORAL_TABLET | Freq: Four times a day (QID) | ORAL | Status: DC | PRN

## 2023-09-02 MED ORDER — LACTATED RINGERS IV SOLN: INTRAVENOUS | Status: DC

## 2023-09-02 MED ORDER — HEPARIN SODIUM (PORCINE) 5000 UNIT/ML IJ SOLN: 5000.0000 [IU] | Freq: Three times a day (TID) | INTRAMUSCULAR | Status: DC

## 2023-09-02 NOTE — Assessment & Plan Note (Signed)
 EDP discussed with orthopedic, who states no urgent intervention at this time and recommends a sling with outpatient orthopedic follow-up

## 2023-09-02 NOTE — Discharge Instructions (Addendum)
 We are covering you for possible herpes and thrush.  Return to ER for wornseing symptoms or any other concerns  Call ortho to make follow up for your elbow fracture. Call them tomorrow to get seen as soon as possible [facility].   IMPRESSION: Mildly distracted fracture of the tip of the olecranon. Suspected small joint effusion.

## 2023-09-02 NOTE — ED Notes (Signed)
 Lab called for re-draw of blood work. Attempted to get blood work and was unsuccessful.

## 2023-09-02 NOTE — Assessment & Plan Note (Addendum)
 Suspect secondary to beer use Magnesium IV, Polycitra 30 mEq p.o. one-time dose Recheck magnesium in the a.m. Check phosphorus in the a.m. BMP in a.m.

## 2023-09-02 NOTE — Hospital Course (Signed)
 Christian [name] is a 52 male with history of depression, anxiety, neuropathy, alcohol abuse, who presents to the emergency department for chief concerns of suspected seizure.  Vitals in the ED showed temperature of 98.4, respiration rate 16, heart rate of 123, blood pressure 116/80, SpO2 of 98% on room air.  Serum sodium is 130, potassium 3.3, chloride 94, bicarb 22, BUN < 5, serum creatinine of 0.50, EGFR greater than 60, nonfasting blood glucose 110.  HIV was tested in the ED and was nonreactive.  ED treatment: CIWA precaution was initiated.  Patient received sodium chloride 1 L bolus.  Magnesium 2 g IV one-time dose.

## 2023-09-02 NOTE — ED Triage Notes (Signed)
 Pt in via [facility] from home. Per EMS called out for seizures. Post ictal with EMS. Found on the floor with witnessed seizure with girlfriend per EMS. Pt is A&Ox4. Talking with MD at bedside in complete sentences. NAD noted.  BS-120 124/94 96% on RA HR-129

## 2023-09-02 NOTE — Assessment & Plan Note (Signed)
 Status post magnesium 2 g IV per EDP Recheck magnesium level in the a.m.

## 2023-09-02 NOTE — H&P (Addendum)
 History and Physical   Cohen Boettner ZOX:[id number] DOB: [birth year] DOA: 09/02/2023  PCP: Jacky Kindle, FNP  Patient coming from: Home via EMS  I have personally briefly reviewed patient's old medical records in [facility] EMR.  Chief Concern: Seizure-like activity  HPI: [name] is a 89 male with history of depression, anxiety, neuropathy, alcohol abuse, who presents to the emergency department for chief concerns of suspected seizure.  Vitals in the ED showed temperature of 98.4, respiration rate 16, heart rate of 123, blood pressure 116/80, SpO2 of 98% on room air.  Serum sodium is 130, potassium 3.3, chloride 94, bicarb 22, BUN < 5, serum creatinine of 0.50, EGFR greater than 60, nonfasting blood glucose 110.  HIV was tested in the ED and was nonreactive.  ED treatment: CIWA precaution was initiated.  Patient received sodium chloride 1 L bolus.  Magnesium 2 g IV one-time dose. ---------------------------------- At bedside, patient was able to tell me his first and last name, age, location, current calendar year.  He reports that he is trying to quit drinking.  He reports that he has shakes in the morning and has to drink 2-3 beers (1 pint bottles each) to stop the shaking.  He states he does not drink in the evening.  He reports that on 09/01/2023, he did not drink any alcohol.  He reports he had noticed tremors this morning and therefore he had 1 beer so that he could go to work.  He reports he is in between jobs and is supposed to start his new job tomorrow however he is being admitted here  Social history: He is with his girlfriend.  They have not been sexually active for at least 3 years.  He denies recreational drug use.  ROS: Constitutional: no weight change, no fever ENT/Mouth: no sore throat, no rhinorrhea Eyes: no eye pain, no vision changes Cardiovascular: no chest pain, no dyspnea,  no edema, no palpitations Respiratory: no cough, no sputum, no  wheezing Gastrointestinal: no nausea, no vomiting, no diarrhea, no constipation Genitourinary: no urinary incontinence, no dysuria, no hematuria Musculoskeletal: no arthralgias, no myalgias Skin: no skin lesions, no pruritus, Neuro: + weakness, + tremors Psych: no anxiety, no depression, + decrease appetite Heme/Lymph: + bruising, no bleeding  ED Course: Discussed with Edp, patient requiring hospitalization for chief concerns of alcohol withdrawal  Assessment/Plan  Principal Problem:   Alcohol withdrawal (HCC) Active Problems:   Thrombocytopenia (HCC)   Seizures (HCC)   Tobacco dependence   Hyponatremia   Hypomagnesemia   Olecranon fracture, right, closed, initial encounter   Pseudofolliculitis   Ecchymosis   Electrolyte imbalance   Alcohol dependence (HCC)   Assessment and Plan:  * Alcohol withdrawal (HCC) Continue CIWA precaution with IV Ativan LR 150 mL/h, 1 day ordered Telemetry cardiac, inpatient  Electrolyte imbalance Suspect secondary to beer use Magnesium IV, Polycitra 30 mEq p.o. one-time dose Recheck magnesium in the a.m. Check phosphorus in the a.m. BMP in a.m.  Ecchymosis Of bilateral elbows, left greater than right Present on admission  Pseudofolliculitis In the inguinal area Patient has been advised to not manipulate it as this can cause introduction of bacteria and create an abscess Patient endorses understanding and compliance stating that he really wanted to 'pop it' I do not believe there is active infection at this time and I do not believe it is folliculitis If indicated, a.m. team can consider antibiotic treatment as appropriate  Olecranon fracture, right, closed, initial encounter EDP discussed with orthopedic,  who states no urgent intervention at this time and recommends a sling with outpatient orthopedic follow-up  Hypomagnesemia Status post magnesium 2 g IV per EDP Recheck magnesium level in the a.m.  Chart reviewed.   DVT  prophylaxis: Heparin 5000 units subcutaneous every 8 hours Code Status: Full code Diet: Heart healthy diet Family Communication: Updated girlfriend at bedside with patient's permission Disposition Plan: Pending clinical course Consults called: None at this time Admission status: Telemetry cardiac, inpatient  Past Medical History:  Diagnosis Date   Hypertension Na   Past Surgical History:  Procedure Laterality Date   BACK SURGERY     SPINE SURGERY  2017   Social History:  reports that he has been smoking cigarettes. He has a 15 pack-year smoking history. He has never used smokeless tobacco. He reports current alcohol use of about 6.0 standard drinks of alcohol per week. He reports that he does not currently use drugs after having used the following drugs: Marijuana.  No Known Allergies Family History  Problem Relation Age of Onset   Seizures Neg Hx    Family history: Family history reviewed and not pertinent.  Prior to Admission medications   Medication Sig Start Date End Date Taking? Authorizing Provider  acyclovir (ZOVIRAX) 400 MG tablet Take 1 tablet (400 mg total) by mouth 5 (five) times daily for 5 days. 09/02/23 09/07/23 Yes Concha Se, MD  nystatin (MYCOSTATIN) 100000 UNIT/ML suspension Take 5 mLs (500,000 Units total) by mouth 4 (four) times daily for 10 days. 09/02/23 09/12/23 Yes Concha Se, MD  acamprosate (CAMPRAL) 333 MG tablet Take 2 tablets (666 mg total) by mouth 3 (three) times daily. Patient not taking: Reported on 02/01/2023 01/19/23   Merita Norton T, FNP  acetaminophen (TYLENOL) 500 MG tablet Take 1,000 mg by mouth every 6 (six) hours as needed.    [provider]  Cholecalciferol (VITAMIN D3) 50 MCG (2000 UT) capsule Take 1 capsule (2,000 Units total) by mouth daily. Patient not taking: Reported on 03/28/2023 12/22/22   Jacky Kindle, FNP  DULoxetine (CYMBALTA) 60 MG capsule Take 1 capsule (60 mg total) by mouth 2 (two) times daily. Patient not taking:  Reported on 03/28/2023 12/22/22   Jacky Kindle, FNP  folic acid (FOLVITE) 1 MG tablet Take 1 tablet (1 mg total) by mouth daily. Patient not taking: Reported on 03/28/2023 12/22/22   Jacky Kindle, FNP  gabapentin (NEURONTIN) 600 MG tablet Take 1 tablet (600 mg total) by mouth 3 (three) times daily. Patient not taking: Reported on 03/28/2023 12/22/22   Merita Norton T, FNP  thiamine (VITAMIN B1) 100 MG tablet Take 1 tablet (100 mg total) by mouth daily. Patient not taking: Reported on 02/01/2023 12/22/22   Jacky Kindle, FNP  traMADol (ULTRAM) 50 MG tablet Take 100 mg by mouth every 6 (six) hours as needed. Patient not taking: Reported on 03/28/2023    [provider]   Physical Exam: Vitals:   09/02/23 1330 09/02/23 1356 09/02/23 1518 09/02/23 1700  BP: (!) 138/92 116/88  (!) 123/93  Pulse: (!) 129 (!) 123  (!) 116  Resp: 16   14  Temp:   98.4 F (36.9 C)   TempSrc:   Oral   SpO2: 98%   99%  Weight: 81.6 kg     Height:       Constitutional: appears age-appropriate, anxious Eyes: PERRL, lids and conjunctivae normal ENMT: Mucous membranes are moist. Posterior pharynx clear of any exudate or lesions. Age-appropriate  dentition. Hearing appropriate Neck: normal, supple, no masses, no thyromegaly Respiratory: clear to auscultation bilaterally, no wheezing, no crackles. Normal respiratory effort. No accessory muscle use.  Cardiovascular: Regular rate and rhythm, no murmurs / rubs / gallops. No extremity edema. 2+ pedal pulses. No carotid bruits.  Abdomen: no tenderness, no masses palpated, no hepatosplenomegaly. Bowel sounds positive.  Musculoskeletal: no clubbing / cyanosis. No joint deformity upper and lower extremities. Good ROM, no contractures, no atrophy. Normal muscle tone.  Skin: no rashes, lesions, ulcers. No induration.  Pseudofolliculitis, negative for edema, erythema, and visible evidence of abscess Neurologic: Sensation intact. Strength 5/5 in all 4.  Psychiatric: Normal  judgment and insight. Alert and oriented x 3. anxious mood.   EKG: independently reviewed, showing sinus tachycardia with rate of 127, QTc 465  Chest x-ray on Admission: I personally reviewed and I agree with radiologist reading as below.  DG Elbow Complete Right Result Date: 09/02/2023 CLINICAL DATA:  Bruising to bilateral posterior elbows. EXAM: RIGHT ELBOW - COMPLETE 3+ VIEW COMPARISON:  None Available. FINDINGS: Mildly distracted fracture of the tip of the olecranon, appears acute to subacute. Suspected small joint effusion. There is no evidence of arthropathy. Soft tissue swelling of the elbow. IMPRESSION: Mildly distracted fracture of the tip of the olecranon. Suspected small joint effusion. Electronically Signed   By: Hart Robinsons M.D.   On: 09/02/2023 15:12   DG Elbow Complete Left Result Date: 09/02/2023 CLINICAL DATA:  Bruising to bilateral posterior elbows. EXAM: LEFT ELBOW - COMPLETE 3+ VIEW COMPARISON:  None Available. FINDINGS: There is no evidence of fracture, dislocation, or joint effusion. There is no evidence of arthropathy or other focal bone abnormality. Soft tissues are unremarkable. IMPRESSION: No acute osseous abnormality. Electronically Signed   By: Hart Robinsons M.D.   On: 09/02/2023 15:07   CT HEAD WO CONTRAST ( ) Result Date: 09/02/2023 CLINICAL DATA:  Ataxia.  Head trauma. EXAM: CT HEAD WITHOUT CONTRAST CT CERVICAL SPINE WITHOUT CONTRAST TECHNIQUE: Multidetector CT imaging of the head and cervical spine was performed following the standard protocol without intravenous contrast. Multiplanar CT image reconstructions of the cervical spine were also generated. RADIATION DOSE REDUCTION: This exam was performed according to the departmental dose-optimization program which includes automated exposure control, adjustment of the mA and/or kV according to patient size and/or use of iterative reconstruction technique. COMPARISON:  Head CT dated 01/30/2023. FINDINGS: CT HEAD  FINDINGS Brain: The ventricles and sulci are appropriate size for the patient's age. The gray-white matter discrimination is preserved. There is no acute intracranial hemorrhage. No mass effect or midline shift. No extra-axial fluid collection. Vascular: No hyperdense vessel or unexpected calcification. Skull: Normal. Negative for fracture or focal lesion. Sinuses/Orbits: No acute finding. Other: None CT CERVICAL SPINE FINDINGS Alignment: No acute subluxation. Skull base and vertebrae: No acute fracture. Old fractures of C6-T1 spinous processes. Soft tissues and spinal canal: No prevertebral fluid or swelling. No visible canal hematoma. Disc levels:  No acute findings. Upper chest: Negative. Other: None IMPRESSION: 1. No acute intracranial pathology. 2. No acute/traumatic cervical spine pathology. 3. Old fractures of C6-T1 spinous processes. Electronically Signed   By: Elgie Collard M.D.   On: 09/02/2023 14:18   CT Cervical Spine Wo Contrast Result Date: 09/02/2023 CLINICAL DATA:  Ataxia.  Head trauma. EXAM: CT HEAD WITHOUT CONTRAST CT CERVICAL SPINE WITHOUT CONTRAST TECHNIQUE: Multidetector CT imaging of the head and cervical spine was performed following the standard protocol without intravenous contrast. Multiplanar CT image reconstructions of the cervical spine  were also generated. RADIATION DOSE REDUCTION: This exam was performed according to the departmental dose-optimization program which includes automated exposure control, adjustment of the mA and/or kV according to patient size and/or use of iterative reconstruction technique. COMPARISON:  Head CT dated 01/30/2023. FINDINGS: CT HEAD FINDINGS Brain: The ventricles and sulci are appropriate size for the patient's age. The gray-white matter discrimination is preserved. There is no acute intracranial hemorrhage. No mass effect or midline shift. No extra-axial fluid collection. Vascular: No hyperdense vessel or unexpected calcification. Skull: Normal.  Negative for fracture or focal lesion. Sinuses/Orbits: No acute finding. Other: None CT CERVICAL SPINE FINDINGS Alignment: No acute subluxation. Skull base and vertebrae: No acute fracture. Old fractures of C6-T1 spinous processes. Soft tissues and spinal canal: No prevertebral fluid or swelling. No visible canal hematoma. Disc levels:  No acute findings. Upper chest: Negative. Other: None IMPRESSION: 1. No acute intracranial pathology. 2. No acute/traumatic cervical spine pathology. 3. Old fractures of C6-T1 spinous processes. Electronically Signed   By: Elgie Collard M.D.   On: 09/02/2023 14:18   Labs on Admission: I have personally reviewed following labs  CBC: Recent Labs  Lab 09/02/23 1338  WBC 7.8  NEUTROABS 5.9  HGB 14.2  HCT 38.5*  MCV 83.5  PLT 101*   Basic Metabolic Panel: Recent Labs  Lab 09/02/23 1536  NA 130*  K 3.3*  CL 94*  CO2 22  GLUCOSE 110*  BUN <5*  CREATININE 0.51*  CALCIUM 7.9*  MG 1.3*   GFR: Estimated Creatinine Clearance: 120.3 mL/min (A) (by C-G formula based on SCr of 0.51 mg/dL (L)).  Liver Function Tests: Recent Labs  Lab 09/02/23 1536  AST 108*  ALT 41  ALKPHOS 103  BILITOT 4.3*  PROT 6.5  ALBUMIN 3.1*   Recent Labs  Lab 09/02/23 1536  LIPASE 20   Coagulation Profile: Recent Labs  Lab 09/02/23 1536  INR 1.1   This document was prepared using Dragon Voice Recognition software and may include unintentional dictation errors.  Dr. Sedalia Muta [facility]  If 7PM-7AM, please contact overnight-coverage provider If 7AM-7PM, please contact day attending provider [email or web address]  09/02/2023, 7:27 PM

## 2023-09-02 NOTE — ED Provider Notes (Addendum)
 [facility] Provider Note    Event Date/Time   First MD Initiated Contact with Patient 09/02/23 1323     (approximate)   History   Seizures   HPI  Christian [name] is a 43 y.o. male with a history of alcohol withdrawal leading to seizures who comes in with concerns for seizure patient reports being found down on the ground by his girlfriend after reportable seizure activity.  Girlfriend is not here and unclear how long the seizure lasted or what it look like.  On review of records patient has a history of alcohol abuse and alcohol withdrawal seizures.  Patient states that he had to start drinking again secondary to pain in his lips.  He reports some pain on the bilateral edges of his mouth.  Where he reports a history of herpes genitalia but denies ever having it on his face.  He reports there was a blister there that broke open and now he just has a lot of pain when he tries to open his mouth.  Reports that his girlfriend had similar symptoms.  Patient states that he did drink alcohol this morning.Marland Kitchen  He reports that he has previously been on gabapentin but has not been taking any medications recently.  Patient states that he not been drinking any alcohol for some time, however he did drink today secondary to the lip pain a boot leg around 6 AM.  He feels like his body recognize that he had drank and then went into withdrawal.  Patient also reports some discoloration noted on his elbows he denies any known falls.   Physical Exam   Triage Vital Signs: ED Triage Vitals [09/02/23 1326]  Encounter Vitals Group     BP      Systolic BP Percentile      Diastolic BP Percentile      Pulse      Resp      Temp      Temp src      SpO2      Weight      Height 5\' 9"  (1.753 m)     Head Circumference      Peak Flow      Pain Score      Pain Loc      Pain Education      Exclude from Growth Chart     Most recent vital signs: Vitals:   09/02/23 1330 09/02/23 1356   BP: (!) 138/92 116/88  Pulse: (!) 129 (!) 123  Resp: 16   SpO2: 98%      General: Awake, no distress.  CV:  Good peripheral perfusion.  Tachycardic Resp:  Normal effort.  Abd:  No distention.  Soft and nontender Other:  Cranial nerves are intact.  Equal strength in arms and legs Patient has some darkened discoloration noted on his bilateral elbows with full range of motion Patient has difficulty opening his mouth secondary to pain in the bilateral cracks of the mouth.  No obvious blistering noted, white plaques noted in the mouth. Full ROM of neck.   ED Results / Procedures / Treatments   Labs (all labs ordered are listed, but only abnormal results are displayed) Labs Reviewed  CBC WITH DIFFERENTIAL/PLATELET - Abnormal; Notable for the following components:      Result Value   HCT 38.5 (*)    MCHC 36.9 (*)    Platelets 101 (*)    nRBC 0.3 (*)    All other components within  normal limits  COMPREHENSIVE METABOLIC PANEL WITH GFR  MAGNESIUM  LIPASE, BLOOD  URINALYSIS, ROUTINE W REFLEX MICROSCOPIC  URINE DRUG SCREEN, QUALITATIVE ([facility] ONLY)  ETHANOL  PROTIME-INR  APTT  CBG MONITORING, ED     EKG  My interpretation of EKG:  Sinus tachycardia rate of 127 without any ST elevation or T wave inversions, normal intervals  RADIOLOGY I have reviewed the ct  personally and interpreted and no evidence of intercranial hemorrhage   PROCEDURES:  Critical Care performed: No  .1-3 Lead EKG Interpretation  Performed by: Concha Se, MD Authorized by: Concha Se, MD     Interpretation: abnormal     ECG rate:  120   ECG rate assessment: tachycardic     Rhythm: sinus tachycardia     Ectopy: none     Conduction: normal      MEDICATIONS ORDERED IN ED: Medications  LORazepam (ATIVAN) injection 0-4 mg ( Intravenous Not Given 09/02/23 1413)    Or  LORazepam (ATIVAN) tablet 0-4 mg ( Oral See Alternative 09/02/23 1413)  LORazepam (ATIVAN) injection 0-4 mg (has no  administration in time range)    Or  LORazepam (ATIVAN) tablet 0-4 mg (has no administration in time range)  thiamine (VITAMIN B1) tablet 100 mg ( Oral See Alternative 09/02/23 1411)    Or  thiamine (VITAMIN B1) injection 100 mg (100 mg Intravenous Given 09/02/23 1411)  sodium chloride 0.9 % bolus 1,000 mL (1,000 mLs Intravenous New Bag/Given 09/02/23 1410)  LORazepam (ATIVAN) injection 1 mg (1 mg Intravenous Given 09/02/23 1410)     IMPRESSION / MDM / ASSESSMENT AND PLAN / ED COURSE  I reviewed the triage vital signs and the nursing notes.   Patient's presentation is most consistent with acute presentation with potential threat to life or bodily function.   Patient comes in with history of alcohol withdrawal seizures.  Did report drinking today.  Significantly tachycardic we will get some fluids and give 1 mg of Ativan due to concern for potential withdrawal seizures will get labs to evaluate for Electra abnormalities, CT imaging evaluate for intracranial hemorrhage, cervical fracture.  I suspect patient could have some herpes gingivostamitis and that they would be reasonable to treat with acyclovir to try to help with patient's symptomsof mouth pain.  This does not seem consistent with herpes encephalitis or herpes seizures. He has what is concerning for thrush on his examination of the tongue as well.   He is afebrile no neck stiffness and has had seizures previously from alcohol use.  I also tried to contact the girlfriend to even see if it was a seizure that happened or if he had just fallen given possible alcohol intoxication.  But this seems less likely at this time  I attempted call patient's girlfriend get more characterization of what the seizure look like and to ensure that it was actually a seizure but the phone number is not correct and the patient does not know the girlfriend's number.  He states that she should be coming by later  CBC shows a reassuring hemoglobin.  Patient will be  handed off to oncoming team pending reassessment and lab work  + olecrenon fx on R- NV intact on examination no signs of ulnar injury. Will place in splint and will need ortho follow up.    The patient is on the cardiac monitor to evaluate for evidence of arrhythmia and/or significant heart rate changes.      FINAL CLINICAL IMPRESSION(S) / ED DIAGNOSES  Final diagnoses:  Seizure (HCC)  Herpes gingivostomatitis  Thrush     Rx / DC Orders   ED Discharge Orders          Ordered    acyclovir (ZOVIRAX) 400 MG tablet  5 times daily        09/02/23 1437    nystatin (MYCOSTATIN) 100000 UNIT/ML suspension  4 times daily        09/02/23 1505             Note:  This document was prepared using Dragon voice recognition software and may include unintentional dictation errors.   Concha Se, MD 09/02/23 1440    Concha Se, MD 09/02/23 1506    Concha Se, MD 09/02/23 [phone]

## 2023-09-02 NOTE — Assessment & Plan Note (Signed)
 Of bilateral elbows, left greater than right Present on admission

## 2023-09-02 NOTE — ED Provider Notes (Signed)
-----------------------------------------   6:05 PM on 09/02/2023 -----------------------------------------  Lab workup is significant for mild hypomagnesemia.  I have ordered magnesium replacement.  However, the patient is still tachycardic to the 110s and still appears tremulous.  I am concerned for impending or worsening alcohol withdrawal and recommended that we admit the patient for further management.  He is in agreement with this plan.  I consulted Dr. Sedalia Muta from the hospitalist service; based on our discussion she agrees to evaluate the patient for admission.  I also consulted Dr. Steward Drone from orthopedics and reviewed the imaging findings with him.  He advises that the olecranon fracture can be treated just with a sling and does not require splinting.   Dionne Bucy, MD 09/02/23 [phone]

## 2023-09-02 NOTE — Assessment & Plan Note (Signed)
 In the inguinal area Patient has been advised to not manipulate it as this can cause introduction of bacteria and create an abscess Patient endorses understanding and compliance stating that he really wanted to 'pop it' I do not believe there is active infection at this time and I do not believe it is folliculitis If indicated, a.m. team can consider antibiotic treatment as appropriate

## 2023-09-02 NOTE — Assessment & Plan Note (Addendum)
 Continue CIWA precaution with IV Ativan LR 150 mL/h, 1 day ordered Telemetry cardiac, inpatient

## 2023-09-02 NOTE — ED Notes (Signed)
 Unable to find any cardiac leads to put pt on cardiac monitoring.

## 2023-09-03 ENCOUNTER — Other Ambulatory Visit: Payer: Self-pay

## 2023-09-03 LAB — CBC
HCT: 36.8 % — ABNORMAL LOW (ref 39.0–52.0)
Hemoglobin: 13.2 g/dL (ref 13.0–17.0)
MCH: 31.9 pg (ref 26.0–34.0)
MCHC: 35.9 g/dL (ref 30.0–36.0)
MCV: 88.9 fL (ref 80.0–100.0)
Platelets: 104 10*3/uL — ABNORMAL LOW (ref 150–400)
RBC: 4.14 MIL/uL — ABNORMAL LOW (ref 4.22–5.81)
RDW: 14.5 % (ref 11.5–15.5)
WBC: 8.4 10*3/uL (ref 4.0–10.5)
nRBC: 0.4 % — ABNORMAL HIGH (ref 0.0–0.2)

## 2023-09-03 LAB — BASIC METABOLIC PANEL WITH GFR
Anion gap: 9 (ref 5–15)
BUN: 6 mg/dL (ref 6–20)
CO2: 26 mmol/L (ref 22–32)
Calcium: 8.4 mg/dL — ABNORMAL LOW (ref 8.9–10.3)
Chloride: 98 mmol/L (ref 98–111)
Creatinine, Ser: 0.53 mg/dL — ABNORMAL LOW (ref 0.61–1.24)
GFR, Estimated: >60 mL/min (ref >60)
Glucose, Bld: 88 mg/dL (ref 70–99)
Potassium: 3.8 mmol/L (ref 3.5–5.1)
Sodium: 133 mmol/L — ABNORMAL LOW (ref 135–145)

## 2023-09-03 LAB — MAGNESIUM: Magnesium: 2 mg/dL (ref 1.7–2.4)

## 2023-09-03 LAB — PHOSPHORUS: Phosphorus: 2.9 mg/dL (ref 2.5–4.6)

## 2023-09-03 NOTE — ED Notes (Signed)
 Pt was non compliant with keeping BP cuff on. That is why there are no updated vitals.

## 2023-09-03 NOTE — Plan of Care (Signed)
 As per chart review patient was admitted with chief complaint of seizure-like activity. Patient was getting treatment for alcohol withdrawal, and electrolyte imbalance I just got notification by RN around 7:33 AM that patient left AMA.  I did not get a chance to see the patient and he did not wait for me to be seen.

## 2023-09-03 NOTE — ED Notes (Signed)
 PT wanted to leave AMA. Another RN had pt sign AMA paperwork and removed pt's IV while this RN was in with a critical pt.

## 2023-09-14 ENCOUNTER — Other Ambulatory Visit: Payer: Self-pay
